# Patient Record
Sex: Female | Born: 1970 | Hispanic: Yes | Marital: Married | Smoking: Never smoker
Health system: Southern US, Community
[De-identification: ages and names within clinical notes are randomized; demographics above are authoritative.]

## PROBLEM LIST (undated history)

## (undated) DIAGNOSIS — E119 Type 2 diabetes mellitus without complications: Secondary | ICD-10-CM

## (undated) DIAGNOSIS — I10 Essential (primary) hypertension: Secondary | ICD-10-CM

## (undated) HISTORY — PX: NASAL SINUS SURGERY: SHX719

---

## 2013-05-29 ENCOUNTER — Encounter (HOSPITAL_COMMUNITY): Payer: Self-pay | Admitting: Emergency Medicine

## 2013-05-29 ENCOUNTER — Emergency Department (HOSPITAL_COMMUNITY): Payer: Self-pay

## 2013-05-29 ENCOUNTER — Emergency Department (HOSPITAL_COMMUNITY)
Admission: EM | Admit: 2013-05-29 | Discharge: 2013-05-29 | Disposition: A | Discharge status: Home / Self Care | Payer: Self-pay | Attending: Emergency Medicine | Admitting: Emergency Medicine

## 2013-05-29 DIAGNOSIS — R69 Illness, unspecified: Secondary | ICD-10-CM

## 2013-05-29 DIAGNOSIS — Z3202 Encounter for pregnancy test, result negative: Secondary | ICD-10-CM | POA: Insufficient documentation

## 2013-05-29 DIAGNOSIS — E119 Type 2 diabetes mellitus without complications: Secondary | ICD-10-CM | POA: Insufficient documentation

## 2013-05-29 DIAGNOSIS — R638 Other symptoms and signs concerning food and fluid intake: Secondary | ICD-10-CM | POA: Insufficient documentation

## 2013-05-29 DIAGNOSIS — Z79899 Other long term (current) drug therapy: Secondary | ICD-10-CM | POA: Insufficient documentation

## 2013-05-29 DIAGNOSIS — J111 Influenza due to unidentified influenza virus with other respiratory manifestations: Secondary | ICD-10-CM | POA: Insufficient documentation

## 2013-05-29 DIAGNOSIS — I10 Essential (primary) hypertension: Secondary | ICD-10-CM | POA: Insufficient documentation

## 2013-05-29 HISTORY — DX: Type 2 diabetes mellitus without complications: E11.9

## 2013-05-29 HISTORY — DX: Essential (primary) hypertension: I10

## 2013-05-29 LAB — CBG MONITORING, ED: Glucose-Capillary: 114 mg/dL — ABNORMAL HIGH (ref 70–99)

## 2013-05-29 LAB — CBC WITH DIFFERENTIAL/PLATELET
BASOS PCT: 0 % (ref 0–1)
Basophils Absolute: 0 10*3/uL (ref 0.0–0.1)
EOS ABS: 0 10*3/uL (ref 0.0–0.7)
Eosinophils Relative: 0 % (ref 0–5)
HCT: 32.7 % — ABNORMAL LOW (ref 36.0–46.0)
HEMOGLOBIN: 9.8 g/dL — AB (ref 12.0–15.0)
Lymphocytes Relative: 10 % — ABNORMAL LOW (ref 12–46)
Lymphs Abs: 0.6 10*3/uL — ABNORMAL LOW (ref 0.7–4.0)
MCH: 20.9 pg — AB (ref 26.0–34.0)
MCHC: 30 g/dL (ref 30.0–36.0)
MCV: 69.6 fL — ABNORMAL LOW (ref 78.0–100.0)
MONO ABS: 0.5 10*3/uL (ref 0.1–1.0)
Monocytes Relative: 8 % (ref 3–12)
Neutro Abs: 5.2 10*3/uL (ref 1.7–7.7)
Neutrophils Relative %: 82 % — ABNORMAL HIGH (ref 43–77)
PLATELETS: 278 10*3/uL (ref 150–400)
RBC: 4.7 MIL/uL (ref 3.87–5.11)
RDW: 18.5 % — ABNORMAL HIGH (ref 11.5–15.5)
WBC: 6.3 10*3/uL (ref 4.0–10.5)

## 2013-05-29 LAB — URINALYSIS, ROUTINE W REFLEX MICROSCOPIC
Bilirubin Urine: NEGATIVE
Glucose, UA: NEGATIVE mg/dL
Hgb urine dipstick: NEGATIVE
Ketones, ur: 15 mg/dL — AB
Leukocytes, UA: NEGATIVE
NITRITE: NEGATIVE
Protein, ur: NEGATIVE mg/dL
Urobilinogen, UA: 0.2 mg/dL (ref 0.0–1.0)
pH: 6 (ref 5.0–8.0)

## 2013-05-29 LAB — COMPREHENSIVE METABOLIC PANEL
ALT: 20 U/L (ref 0–35)
AST: 16 U/L (ref 0–37)
Albumin: 3.7 g/dL (ref 3.5–5.2)
Alkaline Phosphatase: 92 U/L (ref 39–117)
BUN: 7 mg/dL (ref 6–23)
CALCIUM: 8.8 mg/dL (ref 8.4–10.5)
CO2: 20 mEq/L (ref 19–32)
Chloride: 99 mEq/L (ref 96–112)
Creatinine, Ser: 0.58 mg/dL (ref 0.50–1.10)
GFR calc Af Amer: >90 mL/min (ref >90)
GFR calc non Af Amer: >90 mL/min (ref >90)
GLUCOSE: 127 mg/dL — AB (ref 70–99)
Potassium: 4.1 mEq/L (ref 3.7–5.3)
SODIUM: 134 meq/L — AB (ref 137–147)
Total Bilirubin: 0.3 mg/dL (ref 0.3–1.2)
Total Protein: 7.6 g/dL (ref 6.0–8.3)

## 2013-05-29 LAB — LACTIC ACID, PLASMA: LACTIC ACID, VENOUS: 1 mmol/L (ref 0.5–2.2)

## 2013-05-29 LAB — POC OCCULT BLOOD, ED: Fecal Occult Bld: NEGATIVE

## 2013-05-29 LAB — PREGNANCY, URINE: PREG TEST UR: NEGATIVE

## 2013-05-29 LAB — LIPASE, BLOOD: LIPASE: 29 U/L (ref 11–59)

## 2013-05-29 MED ORDER — ONDANSETRON HCL 4 MG/2ML IJ SOLN
4.0000 mg | Freq: Once | INTRAMUSCULAR | Status: AC
  Administered 2013-05-29: 4 mg via INTRAVENOUS
  Filled 2013-05-29: qty 2

## 2013-05-29 MED ORDER — SODIUM CHLORIDE 0.9 % IV SOLN
1000.0000 mL | Freq: Once | INTRAVENOUS | Status: AC
  Administered 2013-05-29: 1000 mL via INTRAVENOUS

## 2013-05-29 MED ORDER — SODIUM CHLORIDE 0.9 % IV BOLUS (SEPSIS)
1000.0000 mL | Freq: Once | INTRAVENOUS | Status: AC
  Administered 2013-05-29: 1000 mL via INTRAVENOUS

## 2013-05-29 MED ORDER — ACETAMINOPHEN 325 MG PO TABS
650.0000 mg | ORAL_TABLET | Freq: Once | ORAL | Status: AC
  Administered 2013-05-29: 650 mg via ORAL
  Filled 2013-05-29: qty 2

## 2013-05-29 MED ORDER — HYDROMORPHONE HCL PF 1 MG/ML IJ SOLN
1.0000 mg | Freq: Once | INTRAMUSCULAR | Status: AC
  Administered 2013-05-29: 1 mg via INTRAVENOUS
  Filled 2013-05-29: qty 1

## 2013-05-29 MED ORDER — SODIUM CHLORIDE 0.9 % IV SOLN: 1000.0000 mL | INTRAVENOUS | Status: DC

## 2013-05-29 NOTE — Discharge Instructions (Signed)
Your us shows some abnormality of the liver which is likely due to fat but needs to be followed up, possibly with an MRI to assure there is no other abnormality, specifically a mass.   Gripe en el adulto  (Influenza, Adult)  La gripe (influenza) es una infeccin en la boca, la nariz y la garganta (tracto respiratorio) causada por un virus. La gripe puede hacer que se sienta muy mal. Se disemina fcilmente de Neomia Dearuna persona a otra (se contagia).  CUIDADOS EN EL HOGAR   Tome slo los medicamentos que le haya indicado el mdico.  Utilice un humidificador de niebla fra para facilitar la respiracin.  Descanse todo lo que pueda [place] que la fiebre haya bajado. Generalmente esto lleva entre 3 y [address] das.  Beba gran cantidad de lquido para mantener el pis (orina) de tono claro o amarillo plido.  Cbrase la boca y la Darene Lamernariz al toser o estornudar.  Lave bien sus manos para evitar el contagio de la enfermedad.  Lanny HurstQudese en su casa y no concurra al Aleen Campitrabajo o a la escuela hasta que la fiebre haya desaparecido al menos por 1 da completo.  Colquese la vacuna antigripal todos los [place]. SOLICITE AYUDA DE INMEDIATO SI:   Siente dificultad para respirar o Company secretaryle falta el aire.  Observa que la piel o las uas estn Gardenaazules.  Presenta dolor intenso o rigidez en el cuello.  Comienza a sentir dolor de cabeza, en el rostro o en los odos.  La fiebre empeora o aparece nuevamente.  Tiene Programme researcher, broadcasting/film/videomalestar estomacal (nuseas), vmitos, o tiene deposiciones acuosas (diarrea.  Siente dolor en el pecho.  Tiene una tos intensa que empeora o elimina un esputo ms espeso. ASEGRESE DE QUE:   Comprende estas instrucciones.  Controlar su enfermedad.  Solicitar ayuda de inmediato si no mejora o si empeora. Document Released: 05/29/2008 Document Revised: 09/01/2011 [facility] Patient Information 2014 [place], [place].

## 2013-05-29 NOTE — ED Notes (Signed)
Pt presents with upper abd pain and nausea x1 week, symptoms worsened yesterday, that increase with palpation. Pt reports her symptoms started a week ago after starting her new diabetes medication. Pt also has chills, body aches, watery eyes, nasal congestion and runny nose x2 days

## 2013-05-29 NOTE — ED Notes (Addendum)
Pt transported to XR and [place]

## 2013-05-29 NOTE — ED Notes (Signed)
Pts husband states that pt started experiencing abd pain and nausea yesterday. Denies vomiting and diarrhea.

## 2013-05-29 NOTE — ED Notes (Signed)
Pt discharged home with all belongings, pt alert and ambulatory upon discharge, no new rx prescribed, pt verbalizes understanding of discharge instructions, interpreter phone line used for discharg, pt driven home by spouse

## 2013-05-29 NOTE — ED Provider Notes (Addendum)
CSN: [id number]     Arrival date & time 05/29/13  [id number] History   First MD Initiated Contact with Patient 05/29/13 [phone]    Level V caveat secondary to language barrier history obtained through interpreter Chief Complaint  Patient presents with  . Abdominal Pain     (Consider location/radiation/quality/duration/timing/severity/associated sxs/prior Treatment) HPI 43 year old female presents today complaining of nasal congestion, cough, chills, and body aches. Her symptoms began yesterday. She feels worse today. She has been nauseated but has not had any vomiting. She also complains of some upper abdominal pain which began about a week ago and has come and gone. Her husband had similar symptoms in the days preceding her illness. She has not had a known fever, vomiting, or diarrhea. She has a history of non-insulin-dependent diabetes and had her medications changed last week. She states she was diet controlled for a period of time prior to this. She's not sure what her blood sugars have been running. She has been taking liquids but has not been eating as much as usual. [place] C[place] today. [place]   Past Medical History  Diagnosis Date  . Diabetes mellitus without complication   . Hypertension    History reviewed. No pertinent past surgical history. No family history on file. History  Substance Use Topics  . Smoking status: Never Smoker   . Smokeless tobacco: Not on file  . Alcohol Use: No   OB History   Grav Para Term Preterm Abortions TAB SAB Ect Mult Living                 Review of Systems  Constitutional: Positive for chills and appetite change. Negative for unexpected weight change.  HENT: Positive for congestion.   Eyes: Negative for discharge and itching.  Respiratory: Positive for cough. Negative for apnea, chest tightness and shortness of breath.   Cardiovascular: Negative.    Gastrointestinal: Positive for nausea. Negative for vomiting and diarrhea.  Endocrine: Negative.   Genitourinary: Negative.   Musculoskeletal: Positive for myalgias.  Allergic/Immunologic: Negative.   Neurological: Positive for weakness.  Hematological: Negative.   Psychiatric/Behavioral: Negative.   All other systems reviewed and are negative.      Allergies  Review of patient's allergies indicates no known allergies.  Home Medications   Current Outpatient Rx  Name  Route  Sig  Dispense  Refill  . lisinopril (PRINIVIL,ZESTRIL) 10 MG tablet   Oral   Take 10 mg by mouth daily.         . metFORMIN (GLUCOPHAGE-XR) 500 MG 24 hr tablet   Oral   Take 500 mg by mouth daily with breakfast.          BP 109/71  Pulse 105  Temp(Src) 99.7 F (37.6 C) (Rectal)  Resp 16  Ht 5\' 4"  (1.626 m)  Wt 156 lb (70.761 kg)  BMI 26.76 kg/m2  SpO2 98% Physical Exam WDWN female uncomfortable appearing Heent- normal, eyes perrl,tm normal, mouth -mucous membranes slightly dry, pharynx clear Neck supple cv- tachycardia with regular rhythm Abdomen- soft , moderate ttp epigastrium and ruq Back- normal Extremities- normal Psych- normal affect Neuro- a and o x3, no focal deficits,  Normal gait.  ED Course  Procedures (including critical care time) Labs Review Labs Reviewed  CBC WITH DIFFERENTIAL - Abnormal; Notable for the following:    Hemoglobin 9.8 (*)    HCT 32.7 (*)    MCV  69.6 (*)    MCH 20.9 (*)    RDW 18.5 (*)    Neutrophils Relative % 82 (*)    Lymphocytes Relative 10 (*)    Lymphs Abs 0.6 (*)    All other components within normal limits  COMPREHENSIVE METABOLIC PANEL - Abnormal; Notable for the following:    Sodium 134 (*)    Glucose, Bld 127 (*)    All other components within normal limits  URINALYSIS, ROUTINE W REFLEX MICROSCOPIC - Abnormal; Notable for the following:    Color, Urine STRAW (*)    APPearance HAZY (*)    Specific Gravity, Urine <1.005 (*)     Ketones, ur 15 (*)    All other components within normal limits  CBG MONITORING, ED - Abnormal; Notable for the following:    Glucose-Capillary 114 (*)    All other components within normal limits  LIPASE, BLOOD  PREGNANCY, URINE  LACTIC ACID, PLASMA  OCCULT BLOOD X 1 CARD TO LAB, STOOL  POC OCCULT BLOOD, ED   Imaging Review Dg Chest 2 View  05/29/2013   CLINICAL DATA:  Cough, fever  EXAM: CHEST  2 VIEW  COMPARISON:  None.  FINDINGS: Cardiomediastinal silhouette is unremarkable. No acute infiltrate or pleural effusion. No pulmonary edema. Bony thorax is unremarkable.  IMPRESSION: No active cardiopulmonary disease.   Electronically Signed   By: Natasha Mead M.D.   On: 05/29/2013 08:05   US Abdomen Complete  05/29/2013   CLINICAL DATA:  Abdominal pain.  EXAM: ULTRASOUND ABDOMEN COMPLETE  COMPARISON:  No priors.  FINDINGS: Gallbladder:  No gallstones or wall thickening visualized. No sonographic Murphy sign noted.  Common bile duct:  Diameter: 4 mm in the porta hepatis.  Liver:  Heterogeneously hyperechoic, likely to reflect diffuse fatty infiltration. Several areas of more hypoechoic hepatic parenchyma are noted, the largest one is located centrally measuring approximately 5.1 x 3.6 x 3.8 cm. No intrahepatic biliary ductal dilatation.  IVC:  No abnormality visualized.  Pancreas:  Visualized portion unremarkable.  Spleen:  Size and appearance within normal limits.  8.2 cm in length.  Right Kidney:  Length: 11.0 cm. Echogenicity within normal limits. No mass or hydronephrosis visualized.  Left Kidney:  Length: 11.0 cm. Echogenicity within normal limits. No mass or hydronephrosis visualized.  Abdominal aorta:  No aneurysm visualized.  Other findings:  None.  IMPRESSION: 1. No acute findings to account for the patient's symptoms. Specifically, no evidence of gallstones or findings to suggest acute cholecystitis at this time. 2. Heterogeneous appearance of the liver. This may simply represent hepatic  steatosis with regional areas of focal fatty sparing, however, the possibility of an underlying hepatic mass is not excluded, particularly in the central aspect of the liver. This should be further characterized with followup nonemergent MRI of the abdomen with and without IV gadolinium in the near future.   Electronically Signed   By: Trudie Reed M.D.   On: 05/29/2013 08:48     EKG Interpretation None      MDM   Final diagnoses:  None    Patient presents today with influenza-like illness. She is tachycardic on presentation with systolic blood pressure in the 90s. She received 2 L of normal saline and heart rate has decreased and blood pressure is 105 systolically. Lactic acid is normal. She's also had some abdominal pain in her upper abdomen. She is somewhat tender to palpation in her epigastrium and right upper quadrant. White blood cell count, lipase, and liver function tests  are normal and ultrasound did not show any evidence of gallstones or acute cholecystitis. She does appear to have hepatic steatosis and is advised regarding followup. She feels improved here after fluids and Tylenol and is discharged to home with return precautions.  1 influenza-like illness patient presents today with symptoms consistent with likely viral in etiology with husband having recent similar symptoms. Lactic acid is negative. Initial hr 118, now 90.  SBP 105. I suspect some of her weakness is due to decreased po and taking her lisinopril  She is advised to hold lisinopril until better. 2 abdominal pain unclear etiology but likely secondary to #1.  3 hepatic steatosis this is been discussed with the patient as advised regarding followup. 4- anemia- hemocult negative-denies recent bleeding, does not appear to have acute cause-advised follow up.  5-dm- stable Hilario Quarry, MD 05/29/13 [id number]  Hilario Quarry, MD 05/29/13 1191  Hilario Quarry, MD 07/25/13 [phone]

## 2013-05-29 NOTE — ED Notes (Addendum)
Pt returned from XT/US, ambulated to bathroom with no assistance to obtain UA sample

## 2013-05-29 NOTE — ED Notes (Signed)
EDP Ray at bedside to assess pt and explain plan of care

## 2014-02-05 ENCOUNTER — Emergency Department: Payer: Self-pay | Admitting: Emergency Medicine

## 2014-06-01 ENCOUNTER — Emergency Department: Payer: Self-pay | Admitting: Emergency Medicine

## 2015-03-27 IMAGING — CR DG CHEST 2V
2 series · 2 of 2 positions shown · non-contrast
Comparison: None.

CLINICAL DATA: Cough, fever

EXAM:
CHEST  2 VIEW

[w chest pa]
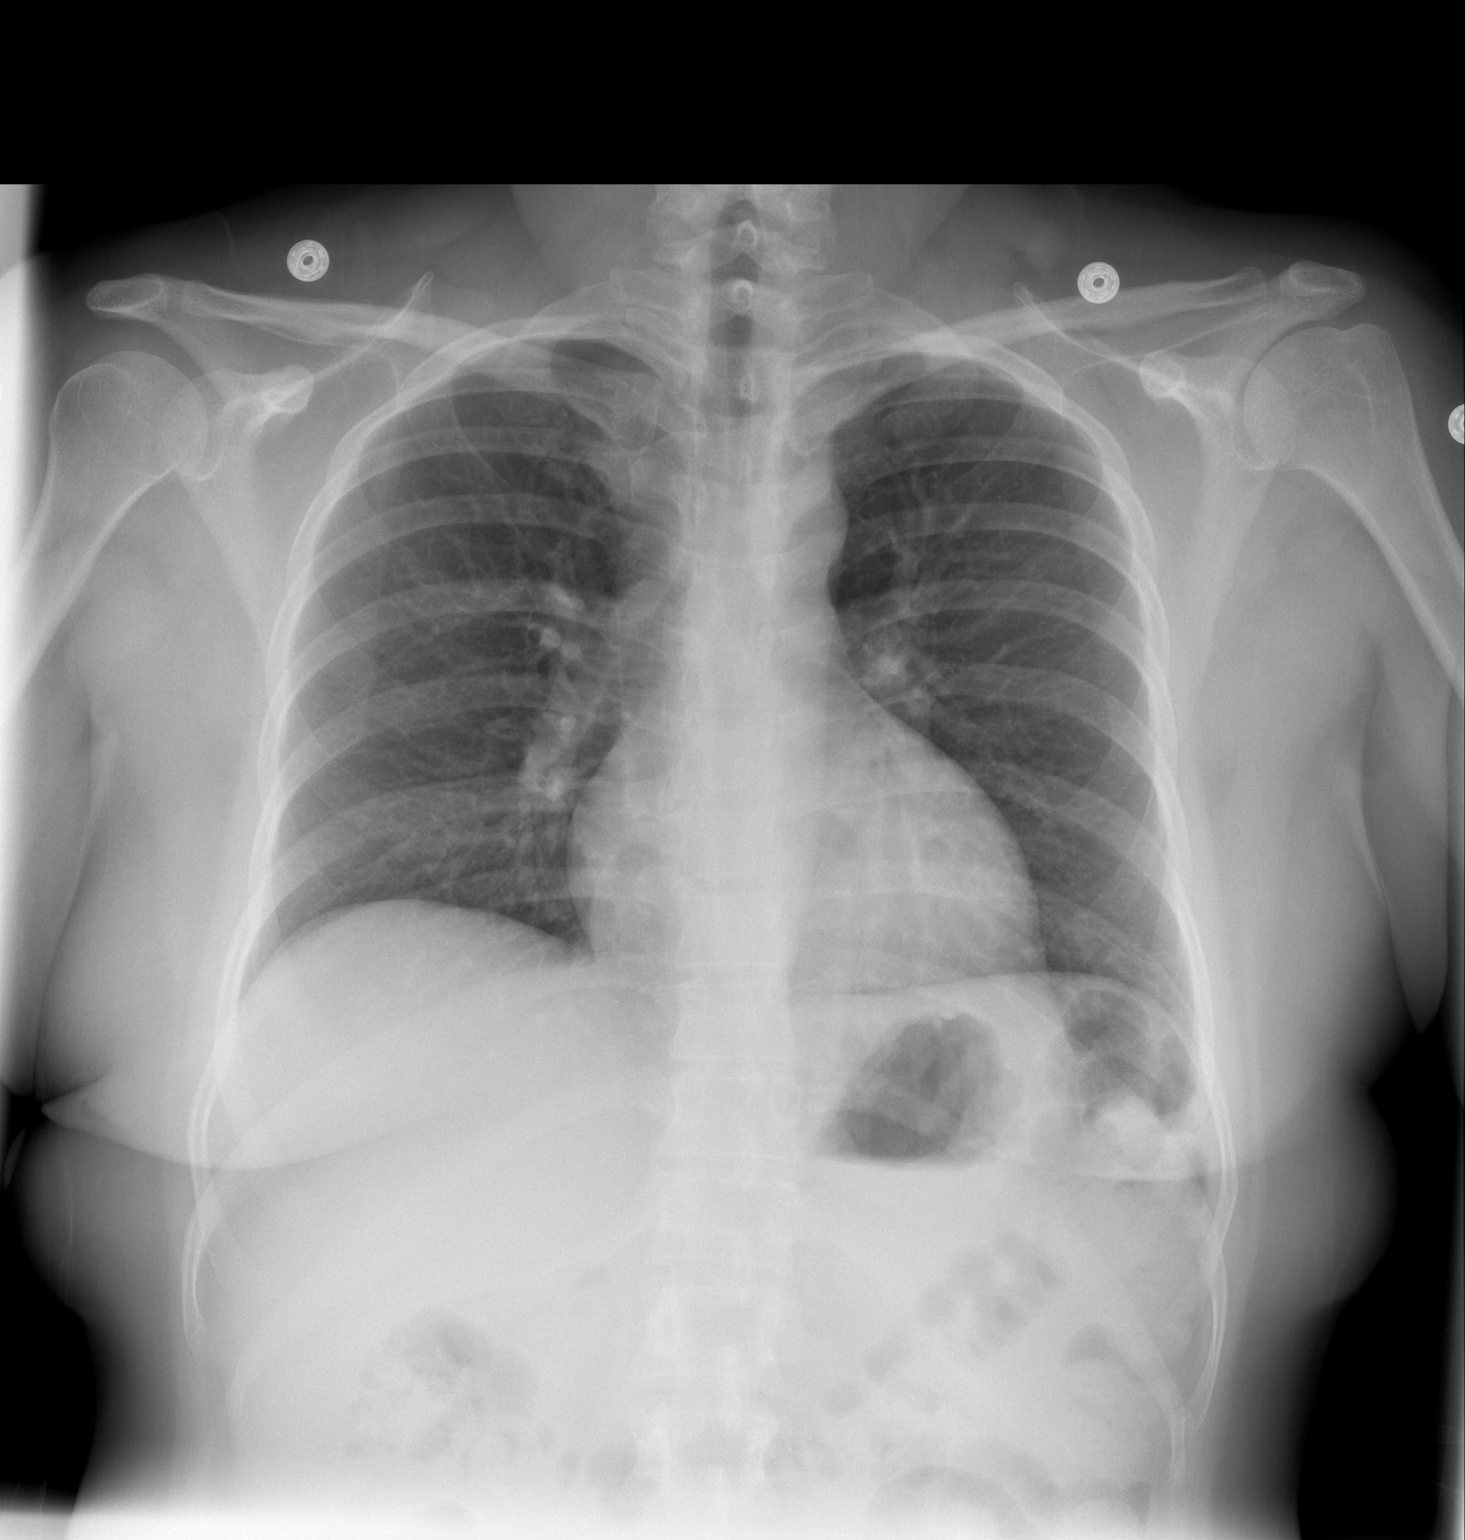

[w chest lat]
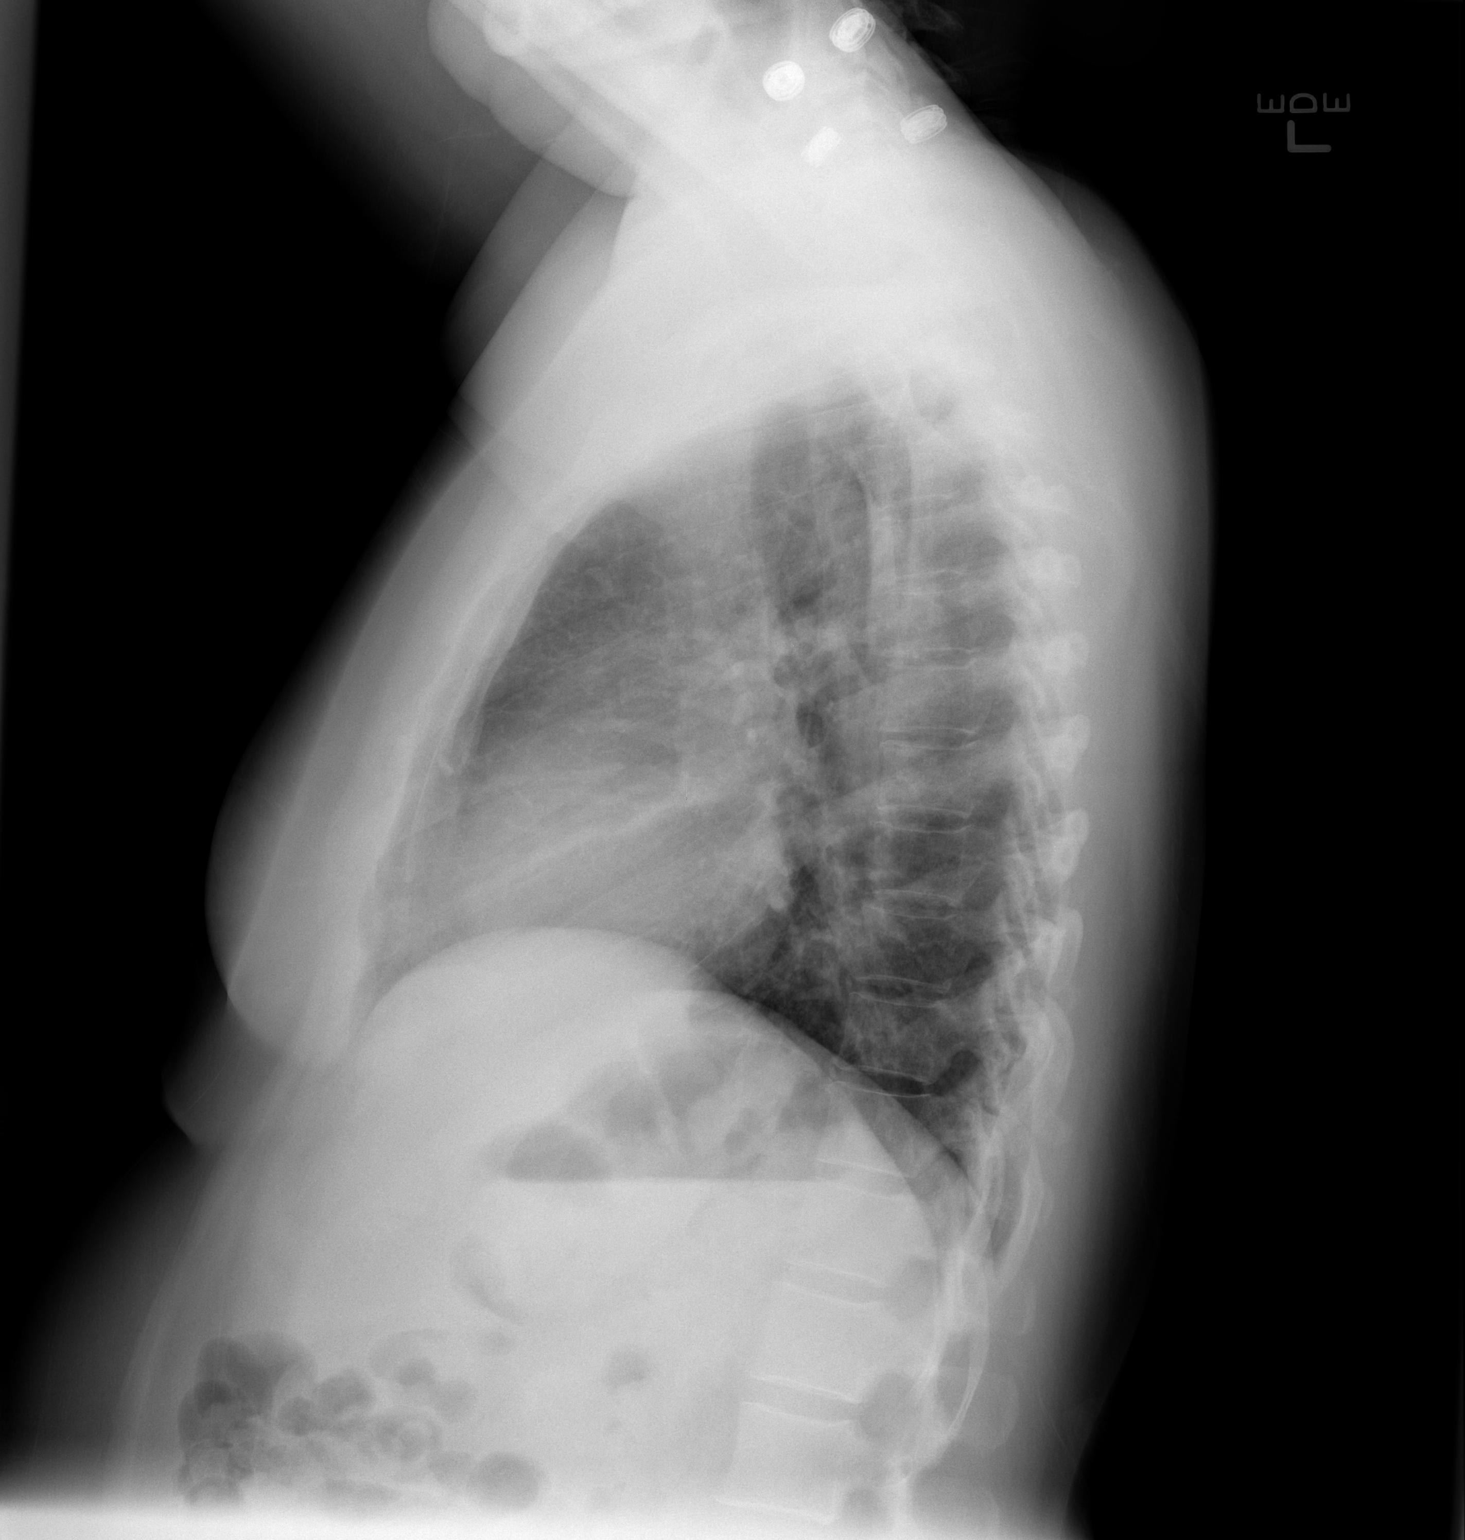

[2 of 2 positions shown; findings below may reference images not displayed]

FINDINGS: Cardiomediastinal silhouette is unremarkable. No acute infiltrate or
pleural effusion. No pulmonary edema. Bony thorax is unremarkable.
IMPRESSION: No active cardiopulmonary disease.

## 2015-09-24 ENCOUNTER — Ambulatory Visit
Admission: EM | Admit: 2015-09-24 | Discharge: 2015-09-24 | Disposition: A | Discharge status: Home / Self Care | Payer: Self-pay | Attending: Internal Medicine | Admitting: Internal Medicine

## 2015-09-24 DIAGNOSIS — H1030 Unspecified acute conjunctivitis, unspecified eye: Secondary | ICD-10-CM

## 2015-09-24 DIAGNOSIS — J019 Acute sinusitis, unspecified: Secondary | ICD-10-CM

## 2015-09-24 MED ORDER — SULFACETAMIDE SODIUM 10 % OP SOLN: 1.0000 [drp] | OPHTHALMIC | Status: AC

## 2015-09-24 MED ORDER — AMOXICILLIN-POT CLAVULANATE 875-125 MG PO TABS: 1.0000 | ORAL_TABLET | Freq: Two times a day (BID) | ORAL | Status: DC

## 2015-09-24 MED ORDER — PREDNISONE 50 MG PO TABS: 50.0000 mg | ORAL_TABLET | Freq: Every day | ORAL | Status: AC

## 2015-09-24 NOTE — Discharge Instructions (Signed)
Prescriptions for amoxicillin/clavulanate and prednisone, and an antibiotic eye drop, were sent to the pharmacy.   Followup with ENT to discuss further options.

## 2015-09-24 NOTE — ED Provider Notes (Signed)
CSN: [id number]     Arrival date & time 09/24/15  1238 History   First MD Initiated Contact with Patient 09/24/15 1527     Chief Complaint  Patient presents with  . Eye Problem  . Eye Pain   HPI 45 year old lady with history of nasal polyps and chronic sinus congestion.  She has had sinus surgery 3 years ago, with recurrence of symptoms in about March of this year. In the last week or 2 she has had purulent eye discharge with intermittent blurriness, and mild swelling and discomfort of the upper and lower lids bilaterally.  Marked sinus congestion. [place]. No fever. Some cough. Postnasal drainage.  Past Medical History  Diagnosis Date  . Diabetes mellitus without complication (HCC)   . Hypertension    Past surgical history: nasal/sinus surgery  No family history on file. Social History  Substance Use Topics  . Smoking status: Never Smoker   . Smokeless tobacco: None  . Alcohol Use: No    Review of Systems  All other systems reviewed and are negative.   Allergies  Review of patient's allergies indicates no known allergies.  Home Medications   Prior to Admission medications   Medication Sig Start Date End Date Taking? Authorizing Provider  lisinopril (PRINIVIL,ZESTRIL) 10 MG tablet Take 10 mg by mouth daily.    Historical Provider, MD  metFORMIN (GLUCOPHAGE-XR) 500 MG 24 hr tablet Take 500 mg by mouth daily with breakfast.    Historical Provider, MD     BP 120/80 mmHg  Pulse 81  Temp(Src) 98 F (36.7 C) (Oral)  SpO2 100%  LMP 08/28/2015 Physical Exam  Constitutional: She is oriented to person, place, and time. No distress.  Alert, nicely groomed  HENT:  Head: Atraumatic.  Bilateral TMs are quite dull, no erythema Marked nasal congestion/occlusion bilaterally Voice sounds markedly congested Throat is pink with copious postnasal drainage and very large pink tonsils  Eyes:  Conjugate gaze, bilateral conjunctivae slightly  injected Bilateral upper and lower eyelid slightly puffy/mildly erythematous No eye discharge evident at the moment   Neck: Neck supple.  Cardiovascular: Normal rate and regular rhythm.   Pulmonary/Chest: No respiratory distress. She has no wheezes. She has no rales.  Lungs clear, symmetric breath sounds  Abdominal: She exhibits no distension.  Musculoskeletal: Normal range of motion.  No leg swelling  Neurological: She is alert and oriented to person, place, and time.  Skin: Skin is warm and dry.  No cyanosis  Nursing note and vitals reviewed.   ED Course  Procedures (including critical care time)  none  MDM   1. Acute sinusitis with symptoms > 10 days   2. Acute conjunctivitis    Meds ordered this encounter  Medications  . predniSONE (DELTASONE) 50 MG tablet    Sig: Take 1 tablet (50 mg total) by mouth daily.    Dispense:  3 tablet    Refill:  0  . amoxicillin-clavulanate (AUGMENTIN) 875-125 MG tablet    Sig: Take 1 tablet by mouth every 12 (twelve) hours.    Dispense:  14 tablet    Refill:  0  . sulfacetamide (BLEPH-10) 10 % ophthalmic solution    Sig: Place 1-2 drops into both eyes every 4 (four) hours.    Dispense:  15 mL    Refill:  0   followup with ENT for further guidance on management of chronic congestion.    Eustace MooreLaura W Harmony Sandell, MD 09/25/15 1400

## 2015-09-24 NOTE — ED Notes (Signed)
bilat eye pain, itchy and redness reported.  Eyes are reddened upon assessment.   Denies blurriness.

## 2016-04-16 ENCOUNTER — Ambulatory Visit
Admission: RE | Admit: 2016-04-16 | Discharge: 2016-04-16 | Disposition: A | Discharge status: Home / Self Care | Payer: Self-pay | Source: Ambulatory Visit | Attending: Family Medicine | Admitting: Family Medicine

## 2016-04-16 ENCOUNTER — Other Ambulatory Visit: Payer: Self-pay | Admitting: Family Medicine

## 2016-04-16 DIAGNOSIS — R52 Pain, unspecified: Secondary | ICD-10-CM

## 2016-04-16 DIAGNOSIS — R071 Chest pain on breathing: Secondary | ICD-10-CM | POA: Insufficient documentation

## 2018-04-06 ENCOUNTER — Ambulatory Visit: Payer: Self-pay

## 2019-06-02 ENCOUNTER — Ambulatory Visit: Payer: Self-pay | Attending: Internal Medicine

## 2019-06-02 DIAGNOSIS — Z23 Encounter for immunization: Secondary | ICD-10-CM

## 2019-06-02 NOTE — Progress Notes (Signed)
   Covid-19 Vaccination Clinic  Name:  Carollee Nussbaumer    MRN: [id number] DOB: [birth year]  06/02/2019  Ms. Lurlie Wigen was observed post Covid-19 immunization for 15 minutes without incident. She was provided with Vaccine Information Sheet and instruction to access the V-Safe system.   Ms. Layton Tappan was instructed to call 911 with any severe reactions post vaccine: Marland Kitchen Difficulty breathing  . Swelling of face and throat  . A fast heartbeat  . A bad rash all over body  . Dizziness and weakness   Immunizations Administered    Name Date Dose VIS Date Route   Pfizer COVID-19 Vaccine 06/02/2019  7:04 PM 0.3 mL 02/24/2019 Intramuscular   Manufacturer: [employer], [employer]   Lot: [id number]   NDC: [address]-8

## 2019-06-24 ENCOUNTER — Ambulatory Visit: Payer: Self-pay | Attending: Internal Medicine

## 2019-06-24 DIAGNOSIS — Z23 Encounter for immunization: Secondary | ICD-10-CM

## 2019-06-24 NOTE — Progress Notes (Signed)
   Covid-19 Vaccination Clinic  Name:  Romanita Fager    MRN: [id number] DOB: August 20, 1970  06/24/2019  Ms. Dallys Nowakowski was observed post Covid-19 immunization for 15 minutes without incident. She was provided with Vaccine Information Sheet and instruction to access the V-Safe system.   Ms. Maki Hege was instructed to call 911 with any severe reactions post vaccine: Marland Kitchen Difficulty breathing  . Swelling of face and throat  . A fast heartbeat  . A bad rash all over body  . Dizziness and weakness   Immunizations Administered    Name Date Dose VIS Date Route   Pfizer COVID-19 Vaccine 06/24/2019  3:25 PM 0.3 mL 02/24/2019 Intramuscular   Manufacturer: [employer], [employer]   Lot: [id number]   NDC: [address]-2

## 2020-06-11 ENCOUNTER — Ambulatory Visit: Payer: Self-pay | Attending: Oncology

## 2020-12-26 ENCOUNTER — Encounter: Payer: Self-pay | Admitting: Emergency Medicine

## 2020-12-26 ENCOUNTER — Emergency Department
Admission: EM | Admit: 2020-12-26 | Discharge: 2020-12-26 | Disposition: A | Discharge status: Home / Self Care | Payer: BLUE CROSS/BLUE SHIELD | Attending: Emergency Medicine | Admitting: Emergency Medicine

## 2020-12-26 ENCOUNTER — Other Ambulatory Visit: Payer: Self-pay

## 2020-12-26 ENCOUNTER — Emergency Department: Payer: BLUE CROSS/BLUE SHIELD

## 2020-12-26 DIAGNOSIS — Z79899 Other long term (current) drug therapy: Secondary | ICD-10-CM | POA: Insufficient documentation

## 2020-12-26 DIAGNOSIS — S93402A Sprain of unspecified ligament of left ankle, initial encounter: Secondary | ICD-10-CM | POA: Diagnosis not present

## 2020-12-26 DIAGNOSIS — S99912A Unspecified injury of left ankle, initial encounter: Secondary | ICD-10-CM | POA: Diagnosis present

## 2020-12-26 DIAGNOSIS — Z7984 Long term (current) use of oral hypoglycemic drugs: Secondary | ICD-10-CM | POA: Insufficient documentation

## 2020-12-26 DIAGNOSIS — I10 Essential (primary) hypertension: Secondary | ICD-10-CM | POA: Insufficient documentation

## 2020-12-26 DIAGNOSIS — M79652 Pain in left thigh: Secondary | ICD-10-CM | POA: Diagnosis not present

## 2020-12-26 DIAGNOSIS — Y9301 Activity, walking, marching and hiking: Secondary | ICD-10-CM | POA: Insufficient documentation

## 2020-12-26 DIAGNOSIS — Y9289 Other specified places as the place of occurrence of the external cause: Secondary | ICD-10-CM | POA: Insufficient documentation

## 2020-12-26 DIAGNOSIS — W108XXA Fall (on) (from) other stairs and steps, initial encounter: Secondary | ICD-10-CM | POA: Insufficient documentation

## 2020-12-26 DIAGNOSIS — E119 Type 2 diabetes mellitus without complications: Secondary | ICD-10-CM | POA: Diagnosis not present

## 2020-12-26 DIAGNOSIS — M25562 Pain in left knee: Secondary | ICD-10-CM | POA: Insufficient documentation

## 2020-12-26 MED ORDER — IBUPROFEN 400 MG PO TABS: 400.0000 mg | ORAL_TABLET | Freq: Four times a day (QID) | ORAL | 0 refills | Status: AC | PRN

## 2020-12-26 MED ORDER — HYDROCODONE-ACETAMINOPHEN 5-325 MG PO TABS
1.0000 | ORAL_TABLET | ORAL | Status: AC
  Administered 2020-12-26: 1 via ORAL
  Filled 2020-12-26: qty 1

## 2020-12-26 MED ORDER — HYDROCODONE-ACETAMINOPHEN 5-325 MG PO TABS: 1.0000 | ORAL_TABLET | ORAL | 0 refills | Status: DC | PRN

## 2020-12-26 NOTE — ED Notes (Signed)
Brace applied to left ankle per order.

## 2020-12-26 NOTE — ED Notes (Signed)
Patient stable and discharged with all personal belongings and AVS. AVS and discharge instructions reviewed with patient and opportunity for questions provided.   

## 2020-12-26 NOTE — Discharge Instructions (Addendum)
Rest ice and elevate the left ankle.  Use ankle splint as needed for ambulation.  Use crutches as needed for ambulation.  Take medications as prescribed and return to the ER for any worsening symptoms or to changes in health.

## 2020-12-26 NOTE — ED Triage Notes (Signed)
Pt to ED from home after fall this morning around 0900 from a couple steps and twisted left ankle.  Was able to continue working at that time but now is having trouble bearing weight and pain is radiating up leg.

## 2020-12-26 NOTE — ED Provider Notes (Signed)
[facility] EMERGENCY DEPARTMENT Provider Note   CSN: [id number] Arrival date & time: 12/26/20  1902     History Chief Complaint  Patient presents with   Toni Flowers is a 50 y.o. female.  Presents to the emergency department for evaluation of a fall that occurred this morning.  She was going down steps, twisted her left ankle and developed lateral ankle pain.  She was able to continue working but now has not been able to walk.  She has had increased pain and swelling to the lateral aspect of the ankle.  She has not any medications for pain.  She is not using any assistive devices for ambulation.  She denies any numbness or tingling.  She does have some soreness along the left leg/knee and thigh but no other injury to her body.  HPI     Past Medical History:  Diagnosis Date   Diabetes mellitus without complication (HCC)    Hypertension     There are no problems to display for this patient.   History reviewed. No pertinent surgical history.   OB History   No obstetric history on file.     History reviewed. No pertinent family history.  Social History   Tobacco Use   Smoking status: Never   Smokeless tobacco: Never  Substance Use Topics   Alcohol use: No   Drug use: No    Home Medications Prior to Admission medications   Medication Sig Start Date End Date Taking? Authorizing Provider  HYDROcodone-acetaminophen (NORCO) 5-325 MG tablet Take 1 tablet by mouth every 4 (four) hours as needed for moderate pain. 12/26/20  Yes Evon Slack, PA-C  ibuprofen (ADVIL) 400 MG tablet Take 1 tablet (400 mg total) by mouth every 6 (six) hours as needed. 12/26/20  Yes Evon Slack, PA-C  amoxicillin-clavulanate (AUGMENTIN) 875-125 MG tablet Take 1 tablet by mouth every 12 (twelve) hours. 09/24/15   Isa Rankin, MD  lisinopril (PRINIVIL,ZESTRIL) 10 MG tablet Take 10 mg by mouth daily.    [provider]  metFORMIN  (GLUCOPHAGE-XR) 500 MG 24 hr tablet Take 500 mg by mouth daily with breakfast.    [provider]  predniSONE (DELTASONE) 50 MG tablet Take 1 tablet (50 mg total) by mouth daily. 09/24/15   Isa Rankin, MD    Allergies    Patient has no known allergies.  Review of Systems   Review of Systems  Constitutional:  Negative for activity change.  Eyes:  Negative for pain and visual disturbance.  Respiratory:  Negative for shortness of breath.   Cardiovascular:  Negative for chest pain and leg swelling.  Gastrointestinal:  Negative for abdominal pain.  Genitourinary:  Negative for flank pain and pelvic pain.  Musculoskeletal:  Positive for arthralgias, gait problem and joint swelling. Negative for myalgias, neck pain and neck stiffness.  Skin:  Negative for wound.  Neurological:  Negative for dizziness, syncope, weakness, light-headedness, numbness and headaches.  Psychiatric/Behavioral:  Negative for confusion and decreased concentration.    Physical Exam Updated Vital Signs BP (!) 121/93 (BP Location: Left Arm)   Pulse (!) 109   Temp 98.4 F (36.9 C) (Oral)   Resp 19   Ht 5' (1.524 m)   Wt 67.1 kg   LMP 08/28/2015   SpO2 98%   BMI 28.90 kg/m   Physical Exam Constitutional:      Appearance: She is well-developed.  HENT:     Head: Normocephalic  and atraumatic.     Right Ear: External ear normal.     Left Ear: External ear normal.     Nose: Nose normal.  Eyes:     Conjunctiva/sclera: Conjunctivae normal.     Pupils: Pupils are equal, round, and reactive to light.  Cardiovascular:     Rate and Rhythm: Normal rate.  Pulmonary:     Effort: Pulmonary effort is normal. No respiratory distress.     Breath sounds: Normal breath sounds.  Abdominal:     Palpations: Abdomen is soft.     Tenderness: There is no abdominal tenderness.  Musculoskeletal:        General: No deformity. Normal range of motion.     Cervical back: Normal range of motion.     Comments: Left  lower extremity shows tenderness to the left lateral ankle with soft tissue swelling.  She is tender along the lateral malleolus and ATFL region.  She is good range of motion of the knee hip with no discomfort.  She has no tenderness to palpation from the proximal hip down to just above the ankle.  She has not tenderness throughout the foot.  Sensation is intact distally.  Skin:    General: Skin is warm and dry.     Findings: No rash.  Neurological:     Mental Status: She is alert and oriented to person, place, and time.     Cranial Nerves: No cranial nerve deficit.     Coordination: Coordination normal.  Psychiatric:        Behavior: Behavior normal.    ED Results / Procedures / Treatments   Labs (all labs ordered are listed, but only abnormal results are displayed) Labs Reviewed - No data to display  EKG None  Radiology DG Ankle Complete Left  Result Date: 12/26/2020 CLINICAL DATA:  Fall, left ankle pain EXAM: LEFT ANKLE COMPLETE - 3+ VIEW COMPARISON:  None. FINDINGS: There is no evidence of fracture, dislocation, or joint effusion. There is no evidence of arthropathy or other focal bone abnormality. Soft tissues are unremarkable. IMPRESSION: Negative. Electronically Signed   By: Helyn Numbers M.D.   On: 12/26/2020 20:07    Procedures Procedures   Medications Ordered in ED Medications  HYDROcodone-acetaminophen (NORCO/VICODIN) 5-325 MG per tablet 1 tablet (1 tablet Oral Given 12/26/20 2059)    ED Course  I have reviewed the triage vital signs and the nursing notes.  Pertinent labs & imaging results that were available during my care of the patient were reviewed by me and considered in my medical decision making (see chart for details).    MDM Rules/Calculators/A&P                         50 year old female with left ankle injury.  History and exam consistent with lateral ankle sprain.  X-rays negative for any acute bony abnormality.  She will rest ice and elevate the  ankle.  She is given crutches and ankle splint.  She will follow-up PCP or orthopedics in 1 week if no improvement.  She understands signs symptoms return to the ER for. Final Clinical Impression(s) / ED Diagnoses Final diagnoses:  Mild ankle sprain, left, initial encounter    Rx / DC Orders ED Discharge Orders          Ordered    HYDROcodone-acetaminophen (NORCO) 5-325 MG tablet  Every 4 hours PRN        12/26/20 2053    ibuprofen (ADVIL)  400 MG tablet  Every 6 hours PRN        12/26/20 2053             Ronnette Juniper 12/26/20 2105    Delton Prairie, MD 12/26/20 2113

## 2021-01-08 ENCOUNTER — Ambulatory Visit
Admission: EM | Admit: 2021-01-08 | Discharge: 2021-01-08 | Disposition: A | Discharge status: Home / Self Care | Payer: BLUE CROSS/BLUE SHIELD

## 2021-01-08 ENCOUNTER — Other Ambulatory Visit: Payer: Self-pay

## 2021-01-08 DIAGNOSIS — J069 Acute upper respiratory infection, unspecified: Secondary | ICD-10-CM | POA: Diagnosis not present

## 2021-01-08 MED ORDER — PROMETHAZINE-DM 6.25-15 MG/5ML PO SYRP: 5.0000 mL | ORAL_SOLUTION | Freq: Four times a day (QID) | ORAL | 0 refills | Status: AC | PRN

## 2021-01-08 MED ORDER — BENZONATATE 100 MG PO CAPS: 200.0000 mg | ORAL_CAPSULE | Freq: Three times a day (TID) | ORAL | 0 refills | Status: AC

## 2021-01-08 MED ORDER — OSELTAMIVIR PHOSPHATE 75 MG PO CAPS: 75.0000 mg | ORAL_CAPSULE | Freq: Two times a day (BID) | ORAL | 0 refills | Status: AC

## 2021-01-08 MED ORDER — IPRATROPIUM BROMIDE 0.06 % NA SOLN
2.0000 | Freq: Four times a day (QID) | NASAL | 12 refills | Status: AC
Start: 2021-01-08 — End: ?

## 2021-01-08 NOTE — ED Triage Notes (Signed)
Pt here with husband, pt with dry cough, rib pain d/t cough, runny nose, fatigue, generalized bodyaches, fever, chills, and bilateral ear pain.   No home COVID test  Pt has had nasal surgery in past, "cannot take COVID test properly".   OTC Advil for pain/fever

## 2021-01-08 NOTE — ED Provider Notes (Signed)
[facility]    CSN: [id number] Arrival date & time: 01/08/21  1905      History   Chief Complaint Chief Complaint  Patient presents with   Fever   Otalgia   Cough    HPI Toni Flowers is a 50 y.o. female.   HPI  50 year old female here for evaluation of respiratory complaints.  Patient is here with her husband for evaluation of runny nose with clear nasal discharge, fever with a T-max of 100.3, fatigue, chills, body aches, bilateral ear pain, sore throat, nonproductive cough, shortness of breath and wheezing.  There is also associated with some pain in the front and back of her chest wall with deep respiration and cough.  She has been exposed to others with influenza A.  She endorses nausea but no vomiting or diarrhea.  Past Medical History:  Diagnosis Date   Diabetes mellitus without complication (HCC)    Hypertension     There are no problems to display for this patient.   Past Surgical History:  Procedure Laterality Date   NASAL SINUS SURGERY      OB History   No obstetric history on file.      Home Medications    Prior to Admission medications   Medication Sig Start Date End Date Taking? Authorizing Provider  benzonatate (TESSALON) 100 MG capsule Take 2 capsules (200 mg total) by mouth every 8 (eight) hours. 01/08/21  Yes Becky Augusta, NP  ibuprofen (ADVIL) 400 MG tablet Take 1 tablet (400 mg total) by mouth every 6 (six) hours as needed. 12/26/20  Yes Amador Cunas C, PA-C  ipratropium (ATROVENT) 0.06 % nasal spray Place 2 sprays into both nostrils 4 (four) times daily. 01/08/21  Yes Becky Augusta, NP  JARDIANCE 10 MG TABS tablet Take 10 mg by mouth daily. 11/07/20  Yes [provider]  lisinopril (PRINIVIL,ZESTRIL) 10 MG tablet Take 10 mg by mouth daily.   Yes [provider]  metFORMIN (GLUCOPHAGE-XR) 500 MG 24 hr tablet Take 500 mg by mouth daily with breakfast.   Yes [provider]  oseltamivir (TAMIFLU) 75  MG capsule Take 1 capsule (75 mg total) by mouth every 12 (twelve) hours. 01/08/21  Yes Becky Augusta, NP  promethazine-dextromethorphan (PROMETHAZINE-DM) 6.25-15 MG/5ML syrup Take 5 mLs by mouth 4 (four) times daily as needed. 01/08/21  Yes Becky Augusta, NP  amoxicillin-clavulanate (AUGMENTIN) 875-125 MG tablet Take 1 tablet by mouth every 12 (twelve) hours. 09/24/15   Isa Rankin, MD  HYDROcodone-acetaminophen (NORCO) 5-325 MG tablet Take 1 tablet by mouth every 4 (four) hours as needed for moderate pain. 12/26/20   Evon Slack, PA-C  predniSONE (DELTASONE) 50 MG tablet Take 1 tablet (50 mg total) by mouth daily. 09/24/15   Isa Rankin, MD    Family History No family history on file.  Social History Social History   Tobacco Use   Smoking status: Never   Smokeless tobacco: Never  Vaping Use   Vaping Use: Never used  Substance Use Topics   Alcohol use: No   Drug use: No     Allergies   Patient has no known allergies.   Review of Systems Review of Systems  Constitutional:  Positive for fever. Negative for activity change and appetite change.  HENT:  Positive for congestion, ear pain, rhinorrhea, sore throat and tinnitus.   Respiratory:  Positive for cough, shortness of breath and wheezing.   Cardiovascular:  Positive for chest pain.  Gastrointestinal:  Positive  for nausea. Negative for diarrhea and vomiting.  Musculoskeletal:  Positive for arthralgias and myalgias.  Skin:  Negative for rash.  Neurological:  Positive for headaches.  Hematological: Negative.   Psychiatric/Behavioral: Negative.      Physical Exam Triage Vital Signs ED Triage Vitals  Enc Vitals Group     BP 01/08/21 1955 125/89     Pulse Rate 01/08/21 1955 96     Resp 01/08/21 1955 20     Temp 01/08/21 1955 98.9 F (37.2 C)     Temp Source 01/08/21 1955 Oral     SpO2 01/08/21 1955 98 %     Weight 01/08/21 1958 140 lb (63.5 kg)     Height 01/08/21 1958 5' (1.524 m)     Head  Circumference --      Peak Flow --      Pain Score 01/08/21 1957 8     Pain Loc --      Pain Edu? --      Excl. in GC? --    No data found.  Updated Vital Signs BP 125/89 (BP Location: Left Arm)   Pulse 96   Temp 98.9 F (37.2 C) (Oral)   Resp 20   Ht 5' (1.524 m)   Wt 140 lb (63.5 kg)   LMP 08/28/2015   SpO2 98%   BMI 27.34 kg/m   Visual Acuity Right Eye Distance:   Left Eye Distance:   Bilateral Distance:    Right Eye Near:   Left Eye Near:    Bilateral Near:     Physical Exam Vitals and nursing note reviewed.  Constitutional:      General: She is not in acute distress.    Appearance: Normal appearance. She is not ill-appearing.  HENT:     Head: Normocephalic and atraumatic.     Right Ear: Tympanic membrane, ear canal and external ear normal. There is no impacted cerumen.     Left Ear: Tympanic membrane, ear canal and external ear normal. There is no impacted cerumen.     Nose: Congestion and rhinorrhea present.     Mouth/Throat:     Mouth: Mucous membranes are moist.     Pharynx: Oropharynx is clear. Posterior oropharyngeal erythema present.  Cardiovascular:     Rate and Rhythm: Normal rate and regular rhythm.     Pulses: Normal pulses.     Heart sounds: Normal heart sounds. No murmur heard.   No gallop.  Pulmonary:     Effort: Pulmonary effort is normal.     Breath sounds: Normal breath sounds. No wheezing, rhonchi or rales.  Musculoskeletal:     Cervical back: Normal range of motion and neck supple.  Lymphadenopathy:     Cervical: No cervical adenopathy.  Skin:    General: Skin is warm and dry.     Capillary Refill: Capillary refill takes less than 2 seconds.     Findings: No erythema or rash.  Neurological:     General: No focal deficit present.     Mental Status: She is alert and oriented to person, place, and time.  Psychiatric:        Mood and Affect: Mood normal.        Behavior: Behavior normal.        Thought Content: Thought content  normal.        Judgment: Judgment normal.     UC Treatments / Results  Labs (all labs ordered are listed, but only abnormal results are displayed) Labs  Reviewed - No data to display  EKG   Radiology No results found.  Procedures Procedures (including critical care time)  Medications Ordered in UC Medications - No data to display  Initial Impression / Assessment and Plan / UC Course  I have reviewed the triage vital signs and the nursing notes.  Pertinent labs & imaging results that were available during my care of the patient were reviewed by me and considered in my medical decision making (see chart for details).  Patient is a pleasant though ill-appearing 50 year old female here for evaluation of respiratory complaints as outlined HPI above.  Patient has had exposure to influenza A but they were reluctant to be tested due to history of no surgery in the past.  Patient's physical exam reveals pearly gray tympanic membranes bilaterally with normal light reflex and clear external auditory canals.  Nasal mucosa is erythematous and edematous with copious clear nasal discharge in both nares.  Oropharyngeal exam reveals posterior oropharyngeal erythema with clear postnasal drip.  No cervical lymphadenopathy appreciated on exam.  Cardiopulmonary exam reveals clear lung sounds in all fields.  Patient's exam is consistent with a viral URI.  Patient reluctant to be swabbed for influenza or COVID secondary to no surgery in the past.  Given her influenza A exposure we will treat her presumptively with Tamiflu.  Loss give Atrovent nasal spray to help with nasal congestion, Tessalon Perles and Promethazine DM cough syrup to help with cough and congestion.  Patient to use Tylenol and ibuprofen as needed for fever and body aches.   Final Clinical Impressions(s) / UC Diagnoses   Final diagnoses:  Viral URI with cough     Discharge Instructions      Take the Tamiflu twice daily for 5 days.    Use the Atrovent nasal spray, 2 squirts in each nostril every 6 hours, as needed for runny nose and postnasal drip.  Use the Tessalon Perles every 8 hours during the day.  Take them with a small sip of water.  They may give you some numbness to the base of your tongue or a metallic taste in your mouth, this is normal.  Use the Promethazine DM cough syrup at bedtime for cough and congestion.  It will make you drowsy so do not take it during the day.  Use OTC Tylenol and Ibuprofen as needed for body aches and fever.   Return for reevaluation or see your primary care provider for any new or worsening symptoms.      ED Prescriptions     Medication Sig Dispense Auth. Provider   benzonatate (TESSALON) 100 MG capsule Take 2 capsules (200 mg total) by mouth every 8 (eight) hours. 21 capsule Becky Augusta, NP   ipratropium (ATROVENT) 0.06 % nasal spray Place 2 sprays into both nostrils 4 (four) times daily. 15 mL Becky Augusta, NP   promethazine-dextromethorphan (PROMETHAZINE-DM) 6.25-15 MG/5ML syrup Take 5 mLs by mouth 4 (four) times daily as needed. 118 mL Becky Augusta, NP   oseltamivir (TAMIFLU) 75 MG capsule Take 1 capsule (75 mg total) by mouth every 12 (twelve) hours. 10 capsule Becky Augusta, NP      PDMP not reviewed this encounter.   Becky Augusta, NP 01/08/21 2042

## 2021-01-08 NOTE — Discharge Instructions (Signed)
Take the Tamiflu twice daily for 5 days.   Use the Atrovent nasal spray, 2 squirts in each nostril every 6 hours, as needed for runny nose and postnasal drip.  Use the Tessalon Perles every 8 hours during the day.  Take them with a small sip of water.  They may give you some numbness to the base of your tongue or a metallic taste in your mouth, this is normal.  Use the Promethazine DM cough syrup at bedtime for cough and congestion.  It will make you drowsy so do not take it during the day.  Use OTC Tylenol and Ibuprofen as needed for body aches and fever.   Return for reevaluation or see your primary care provider for any new or worsening symptoms.

## 2021-07-14 ENCOUNTER — Other Ambulatory Visit: Payer: Self-pay | Admitting: Family Medicine

## 2021-07-14 DIAGNOSIS — Z1231 Encounter for screening mammogram for malignant neoplasm of breast: Secondary | ICD-10-CM

## 2022-07-31 LAB — EXTERNAL GENERIC LAB PROCEDURE

## 2022-07-31 LAB — COLOGUARD

## 2022-09-12 ENCOUNTER — Emergency Department
Admission: EM | Admit: 2022-09-12 | Discharge: 2022-09-12 | Disposition: A | Discharge status: Home / Self Care | Payer: BLUE CROSS/BLUE SHIELD | Attending: Emergency Medicine | Admitting: Emergency Medicine

## 2022-09-12 ENCOUNTER — Other Ambulatory Visit: Payer: Self-pay

## 2022-09-12 ENCOUNTER — Emergency Department: Payer: BLUE CROSS/BLUE SHIELD

## 2022-09-12 DIAGNOSIS — N2889 Other specified disorders of kidney and ureter: Secondary | ICD-10-CM | POA: Diagnosis not present

## 2022-09-12 DIAGNOSIS — R300 Dysuria: Secondary | ICD-10-CM | POA: Diagnosis present

## 2022-09-12 DIAGNOSIS — N3000 Acute cystitis without hematuria: Secondary | ICD-10-CM | POA: Insufficient documentation

## 2022-09-12 DIAGNOSIS — R748 Abnormal levels of other serum enzymes: Secondary | ICD-10-CM | POA: Insufficient documentation

## 2022-09-12 LAB — URINALYSIS, ROUTINE W REFLEX MICROSCOPIC
Bilirubin Urine: NEGATIVE
Glucose, UA: >=500 mg/dL — AB
Ketones, ur: 5 mg/dL — AB
Nitrite: NEGATIVE
Protein, ur: 100 mg/dL — AB
Specific Gravity, Urine: 1.03 (ref 1.005–1.030)
Squamous Epithelial / HPF: NONE SEEN /HPF (ref 0–5)
WBC, UA: >50 WBC/hpf (ref 0–5)
pH: 5 (ref 5.0–8.0)

## 2022-09-12 LAB — CBC
HCT: 47.5 % — ABNORMAL HIGH (ref 36.0–46.0)
Hemoglobin: 15.3 g/dL — ABNORMAL HIGH (ref 12.0–15.0)
MCH: 29.8 pg (ref 26.0–34.0)
MCHC: 32.2 g/dL (ref 30.0–36.0)
MCV: 92.4 fL (ref 80.0–100.0)
Platelets: 275 10*3/uL (ref 150–400)
RBC: 5.14 MIL/uL — ABNORMAL HIGH (ref 3.87–5.11)
RDW: 13.1 % (ref 11.5–15.5)
WBC: 9.1 10*3/uL (ref 4.0–10.5)
nRBC: 0 % (ref 0.0–0.2)

## 2022-09-12 LAB — COMPREHENSIVE METABOLIC PANEL
ALT: 26 U/L (ref 0–44)
AST: 18 U/L (ref 15–41)
Albumin: 4.3 g/dL (ref 3.5–5.0)
Alkaline Phosphatase: 87 U/L (ref 38–126)
Anion gap: 11 (ref 5–15)
BUN: 14 mg/dL (ref 6–20)
CO2: 27 mmol/L (ref 22–32)
Calcium: 10.1 mg/dL (ref 8.9–10.3)
Chloride: 103 mmol/L (ref 98–111)
Creatinine, Ser: 0.62 mg/dL (ref 0.44–1.00)
GFR, Estimated: >60 mL/min (ref >60)
Glucose, Bld: 160 mg/dL — ABNORMAL HIGH (ref 70–99)
Potassium: 4.3 mmol/L (ref 3.5–5.1)
Sodium: 141 mmol/L (ref 135–145)
Total Bilirubin: 0.8 mg/dL (ref 0.3–1.2)
Total Protein: 7.5 g/dL (ref 6.5–8.1)

## 2022-09-12 LAB — LIPASE, BLOOD: Lipase: 60 U/L — ABNORMAL HIGH (ref 11–51)

## 2022-09-12 MED ORDER — SODIUM CHLORIDE 0.9 % IV SOLN
1.0000 g | Freq: Once | INTRAVENOUS | Status: AC
  Administered 2022-09-12: 1 g via INTRAVENOUS
  Filled 2022-09-12: qty 10

## 2022-09-12 MED ORDER — SODIUM CHLORIDE 0.9 % IV BOLUS
1000.0000 mL | Freq: Once | INTRAVENOUS | Status: AC
  Administered 2022-09-12: 1000 mL via INTRAVENOUS

## 2022-09-12 MED ORDER — HYDROCODONE-ACETAMINOPHEN 5-325 MG PO TABS: 1.0000 | ORAL_TABLET | Freq: Four times a day (QID) | ORAL | 0 refills | Status: AC | PRN

## 2022-09-12 MED ORDER — KETOROLAC TROMETHAMINE 15 MG/ML IJ SOLN
15.0000 mg | Freq: Once | INTRAMUSCULAR | Status: AC
  Administered 2022-09-12: 15 mg via INTRAVENOUS
  Filled 2022-09-12: qty 1

## 2022-09-12 MED ORDER — HYDROCODONE-ACETAMINOPHEN 5-325 MG PO TABS
1.0000 | ORAL_TABLET | Freq: Once | ORAL | Status: AC
  Administered 2022-09-12: 1 via ORAL
  Filled 2022-09-12: qty 1

## 2022-09-12 MED ORDER — MORPHINE SULFATE (PF) 4 MG/ML IV SOLN
4.0000 mg | Freq: Once | INTRAVENOUS | Status: AC
  Administered 2022-09-12: 4 mg via INTRAVENOUS
  Filled 2022-09-12: qty 1

## 2022-09-12 MED ORDER — IOHEXOL 300 MG/ML  SOLN
100.0000 mL | Freq: Once | INTRAMUSCULAR | Status: AC | PRN
  Administered 2022-09-12: 80 mL via INTRAVENOUS

## 2022-09-12 MED ORDER — CEPHALEXIN 500 MG PO CAPS: 500.0000 mg | ORAL_CAPSULE | Freq: Four times a day (QID) | ORAL | 0 refills | Status: AC

## 2022-09-12 NOTE — ED Triage Notes (Signed)
C/O lower abdominal pain and pain with urination x 8 days.  The same symptoms occurred about 8 years ago and patient was diagnosed with UTI.

## 2022-09-12 NOTE — ED Provider Notes (Signed)
[facility] Provider Note    Event Date/Time   First MD Initiated Contact with Patient 09/12/22 1128     (approximate)   History   No chief complaint on file.   HPI  Getzemani Dowis is a 52 y.o. female presenting to the emergency department for evaluation of abdominal pain.  Patient reports about 8 days ago she began to develop lower abdominal pain primarily in her suprapubic area with associated dysuria, urinary frequency, and urinary hesitancy.  Does report she had similar symptoms about 8 years ago at which time she was diagnosed with a UTI.  She does report that over the past week, her pain has migrated from her suprapubic region and is now radiating towards her back.  Does report subjective fevers.    Physical Exam   Triage Vital Signs: ED Triage Vitals  Enc Vitals Group     BP 09/12/22 1106 123/81     Pulse Rate 09/12/22 1106 93     Resp 09/12/22 1106 16     Temp 09/12/22 1106 98.2 F (36.8 C)     Temp Source 09/12/22 1106 Oral     SpO2 09/12/22 1106 100 %     Weight 09/12/22 1104 139 lb 15.9 oz (63.5 kg)     Height 09/12/22 1104 5' (1.524 m)     Head Circumference --      Peak Flow --      Pain Score 09/12/22 1102 8     Pain Loc --      Pain Edu? --      Excl. in GC? --     Most recent vital signs: Vitals:   09/12/22 1106  BP: 123/81  Pulse: 93  Resp: 16  Temp: 98.2 F (36.8 C)  SpO2: 100%     General: Awake, interactive, appears somewhat uncomfortable CV:  Regular rate, good peripheral perfusion.  Resp:  Lungs clear, unlabored respirations.  Abd:  Soft, nondistended, tender to palpation in the suprapubic region and throughout the lower abdomen, no significant tenderness of the upper abdomen, no CVA tenderness Neuro:  Symmetric facial movement, fluid speech   ED Results / Procedures / Treatments   Labs (all labs ordered are listed, but only abnormal results are displayed) Labs Reviewed  LIPASE, BLOOD - Abnormal;  Notable for the following components:      Result Value   Lipase 60 (*)    All other components within normal limits  COMPREHENSIVE METABOLIC PANEL - Abnormal; Notable for the following components:   Glucose, Bld 160 (*)    All other components within normal limits  CBC - Abnormal; Notable for the following components:   RBC 5.14 (*)    Hemoglobin 15.3 (*)    HCT 47.5 (*)    All other components within normal limits  URINALYSIS, ROUTINE W REFLEX MICROSCOPIC - Abnormal; Notable for the following components:   Color, Urine YELLOW (*)    APPearance CLOUDY (*)    Glucose, UA >=500 (*)    Hgb urine dipstick MODERATE (*)    Ketones, ur 5 (*)    Protein, ur 100 (*)    Leukocytes,Ua LARGE (*)    Bacteria, UA RARE (*)    All other components within normal limits  URINE CULTURE     EKG EKG independently reviewed interpreted by myself (ER attending) demonstrates:    RADIOLOGY Imaging independently reviewed and interpreted by myself demonstrates:    PROCEDURES:  Critical Care performed: No  Procedures  MEDICATIONS ORDERED IN ED: Medications  sodium chloride 0.9 % bolus 1,000 mL (0 mLs Intravenous Stopped 09/12/22 1309)  morphine (PF) 4 MG/ML injection 4 mg (4 mg Intravenous Given 09/12/22 1152)  iohexol (OMNIPAQUE) 300 MG/ML solution 100 mL (80 mLs Intravenous Contrast Given 09/12/22 1205)  ketorolac (TORADOL) 15 MG/ML injection 15 mg (15 mg Intravenous Given 09/12/22 1352)  HYDROcodone-acetaminophen (NORCO/VICODIN) 5-325 MG per tablet 1 tablet (1 tablet Oral Given 09/12/22 1354)  cefTRIAXone (ROCEPHIN) 1 g in sodium chloride 0.9 % 100 mL IVPB (1 g Intravenous New Bag/Given 09/12/22 1355)     IMPRESSION / MDM / ASSESSMENT AND PLAN / ED COURSE  I reviewed the triage vital signs and the nursing notes.  Differential diagnosis includes, but is not limited to, UTI, pyelonephritis, renal stone, ovarian pathology, appendicitis  Patient's presentation is most consistent with acute  presentation with potential threat to life or bodily function.  52 year old female presenting with lower abdominal pain with associated urinary symptoms, now radiating upwards in her lower back and flank.  Concern for possible development of pyelonephritis.  Labs sent from triage.  Will obtain CT to further evaluate.  Denies nausea, fluids and morphine ordered for symptomatic treatment.  CT demonstrates right ureteritis without evidence of pyelonephritis or renal stones.  Lab work with mildly elevated lipase, less than 3 times the upper limit of normal, not consistent with pancreatitis.  CBC and CMP without severe derangements.  UA is concerning for infection.  On reevaluation, patient did have some ongoing pain.  She was ordered for Toradol and Norco.  Will also give initial dose of Rocephin.   On reevaluation feels much improved.  Will DC with prescription for Keflex and Norco with strict return precautions.      FINAL CLINICAL IMPRESSION(S) / ED DIAGNOSES   Final diagnoses:  Acute cystitis without hematuria  Ureteritis     Rx / DC Orders   ED Discharge Orders          Ordered    HYDROcodone-acetaminophen (NORCO) 5-325 MG tablet  Every 6 hours PRN        09/12/22 1427    cephALEXin (KEFLEX) 500 MG capsule  4 times daily        09/12/22 1427             Note:  This document was prepared using Dragon voice recognition software and may include unintentional dictation errors.   Trinna Post, MD 09/12/22 1520

## 2022-09-12 NOTE — Discharge Instructions (Signed)
You were seen in the emergency department today for your abdominal pain.  Your CT scan and urine testing did show an infection.  I sent a prescription for antibiotics to your pharmacy.  Please take these as directed.  You can use over-the-counter medicine to help with pain.  If you have breakthrough pain, I have sent a prescription for a narcotic medicine to your pharmacy. This can make you drowsy, do not drive or operate machinery when taking this.  Return to the ER for any new or worsening symptoms

## 2022-09-14 LAB — URINE CULTURE: Culture: >=100000 — AB

## 2022-09-30 LAB — COLOGUARD

## 2022-09-30 LAB — EXTERNAL GENERIC LAB PROCEDURE

## 2022-10-24 IMAGING — CR DG ANKLE COMPLETE 3+V*L*
1 series · 3 of 3 positions shown · non-contrast
Comparison: None.

CLINICAL DATA: Fall, left ankle pain

EXAM:
LEFT ANKLE COMPLETE - 3+ VIEW

[Series 1: x ankle ap left · 0.14mm/px · 3 of 3 slices shown]
[im 1/3]
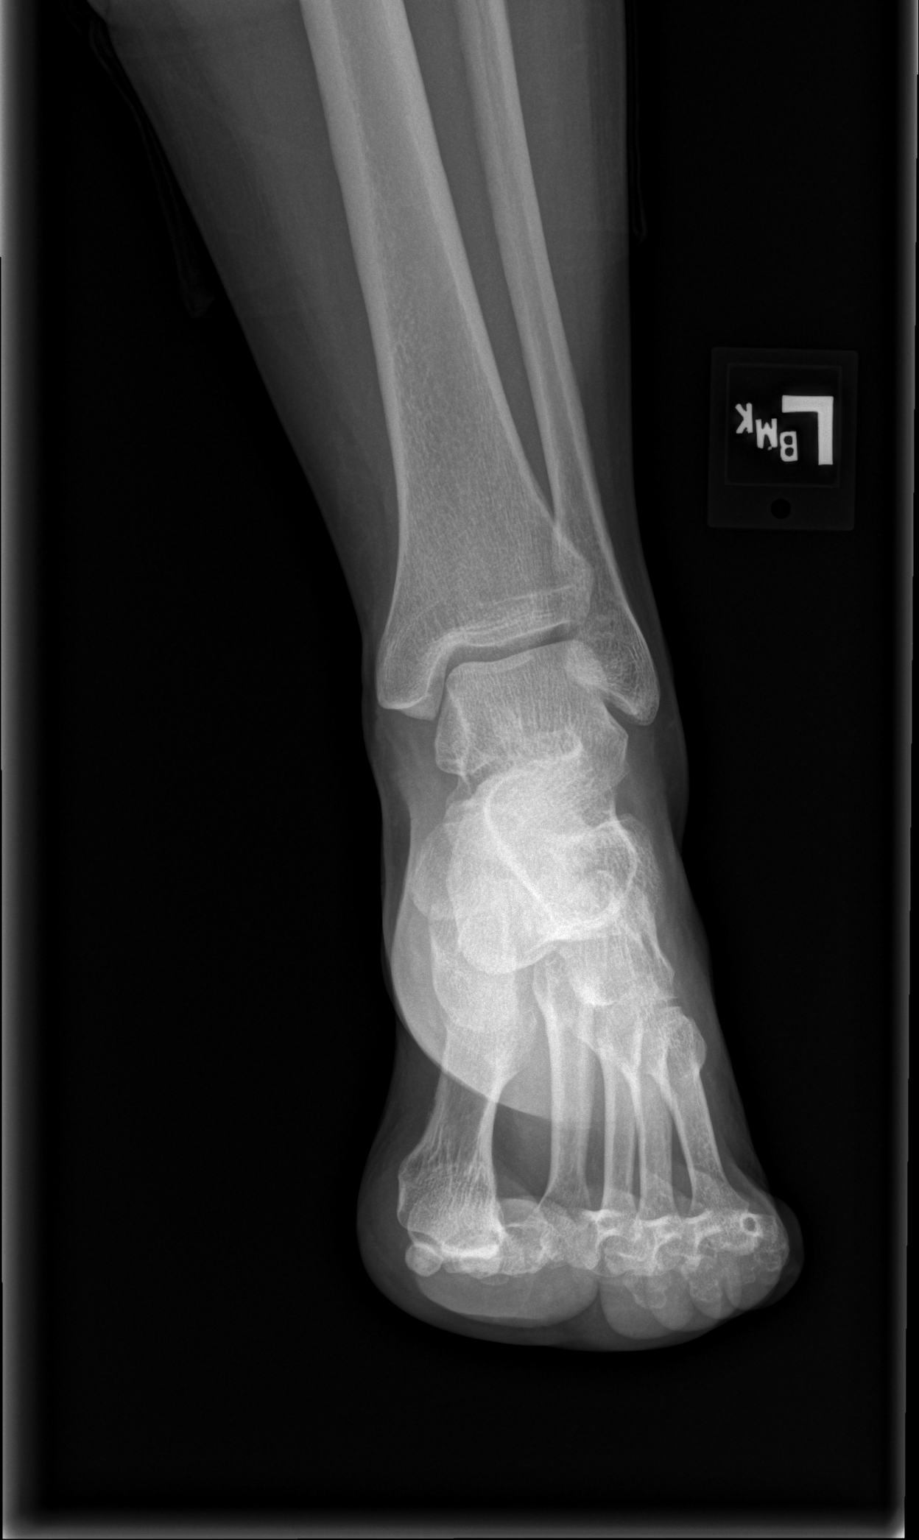
[im 2/3]
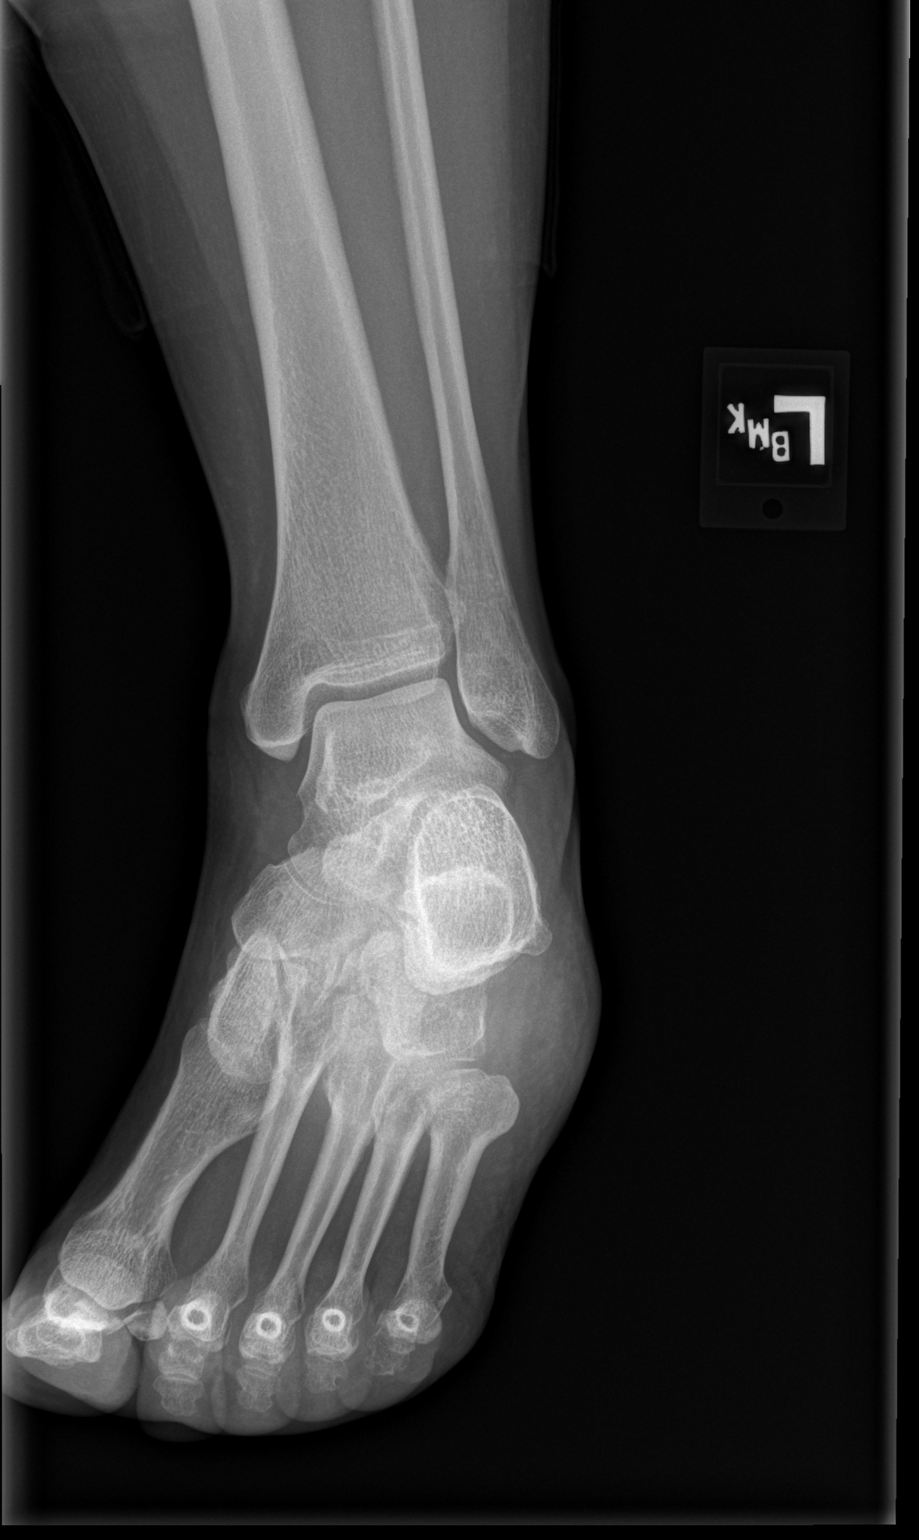
[im 3/3]
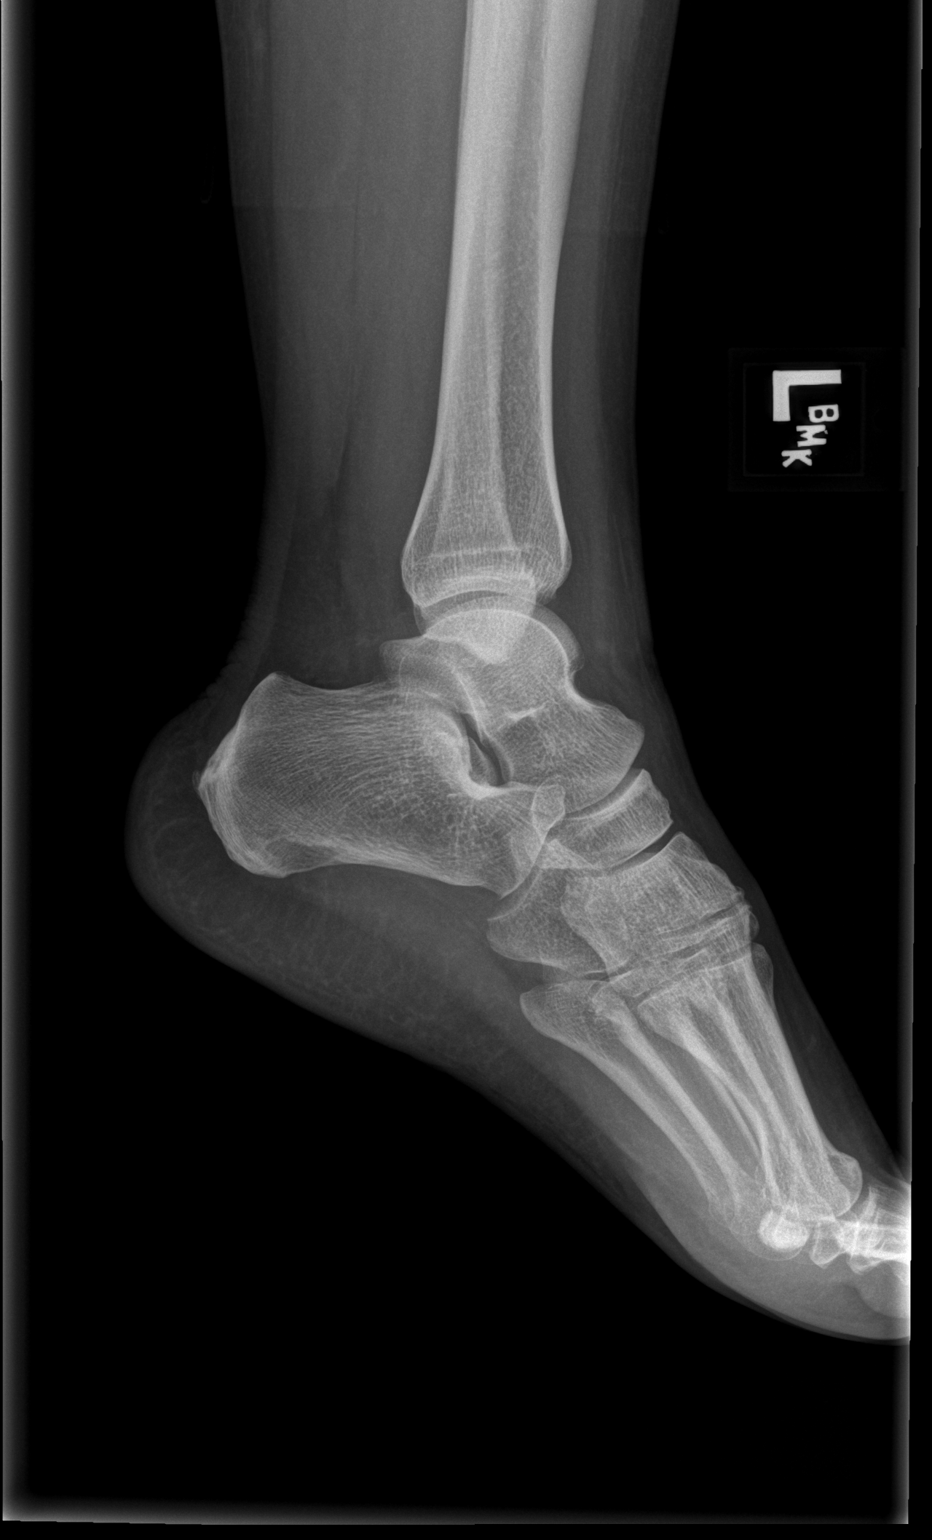

[3 of 3 positions shown; findings below may reference images not displayed]

FINDINGS: There is no evidence of fracture, dislocation, or joint effusion.
There is no evidence of arthropathy or other focal bone abnormality.
Soft tissues are unremarkable.
IMPRESSION: Negative.

## 2022-11-18 LAB — EXTERNAL GENERIC LAB PROCEDURE: COLOGUARD: NEGATIVE

## 2022-11-18 LAB — COLOGUARD: COLOGUARD: NEGATIVE

## 2023-01-27 NOTE — ED Provider Notes (Signed)
 ED Progress Note  Received sign out from Dr. Florie  ED I-PASS Handoff Illness Severit: stable Patient Summary: Toni Flowers is a 52 y.o. female hypertension, hyperlipidemia, diabetes, recurrent falls related to leg pain and weakness following with neurology presenting with mechanical fall, witnessed no head strike. No LOC no AC. Pending final read on XR. Pain controlled.  Action List:  Situation Awareness Horticulturist, commercial): Anticipate discharge. If occult frx, then follow up with ortho

## 2023-01-27 NOTE — ED Provider Notes (Signed)
 [facility] Emergency Department Provider Note  ED Clinical Impression   Final diagnoses:  Fall, initial encounter (Primary)    HPI, ED Course, Assessment and Plan   Initial Clinical Impression:  January 27, 2023 3:54 AM  Toni Flowers is a 52 y.o. female with past medical history of hypertension, hyperlipidemia, diabetes, recurrent falls related to leg pain and weakness following with neurology presenting with mechanical fall this evening.  The patient states that she tripped over something and fell on an outstretched left arm.  She reports diffuse left-sided pain since that time.  She did not strike her head nor did she lose consciousness.  She notes some headache since that time.  She is concerned regarding pain to her elbow, shoulder, rib cage, and of the left side of her back diffusely.  The patient's son and husband at bedside report that the patient cannot lock very long distances due to pain, she has had progression of weakness especially on the left side which is being worked up by neurology and she is receiving PT.  The patient has no vision changes, no vomiting though she is somewhat nauseous.  The patient uses a walker to get around short distances at home and does not travel far at baseline.  At her baseline, she is unable to who go from sitting to standing on her own.  BP 141/83   Pulse 68   Temp 36.3 C (97.3 F) (Oral)   Resp 17   LMP 12/28/2017   SpO2 99%  Initial vital signs are notable for afebrile, nontachycardic, normotensive, saturating well on room air  Pertinent physical exam: On my initial evaluation, the patient is nontoxic appearing, in no acute distress.    Medical Decision Making This is a 52 y.o. female with history as above presenting with after a fall with pain to the left side of her body. Initial considerations included intracranial hemorrhages, brain contusions, cervical spine fractures or dislocations, spinal cord injuries, MSK injuries, syncope  from cardiac etiologies, neurologic etiologies, among others. Patient denies any preceding, subsequent, or associated diaphoresis palpitations, chest pain, dyspnea, nausea, vomiting, or presyncope, any other new medical symptoms. Low concern for medical causes of fall at this time, will defer basic laboratory studies given history of nonsyncopal fall. Patient is well appearing without any signs or symptoms of serious injury. There is no midline c-spine tenderness to suggest cervical spine injury. Nexus criteria negative with no posterior c-spine tenderness, no evidence of intoxication, normal level of alertness, no focal neurologic defecit, and no distracting injuries.  Patient is neurologically intact with low likelihood of TBI according to [place] CT rule with no suspected open or depressed skull fracture, no sign of basilar skull fracture (hemotympanum, CSF leak, racoon eyes, or Battle's sign), not >/= 2 episodes of vomiting, not > 51 y/o, no amnesia prior to impact for > 30 minutes, no dangerous mechanism (Fall > 3 feet or > 5 stairs, pedestrian struck, ejection from MVC), and not on anticoagulation. There is no abdominal pain, ecchymosis, or tenderness on exam to suggest intraabdominal injury. Pelvis without evidence of injury though the patient does have left-sided pelvic tenderness, she is placing weight on the left side.  Will obtain x-ray of the chest to evaluate for displaced rib fractures, left hip, left elbow and shoulder to evaluate for fracture.  Further ED updates and updates to plan as per ED Course below:  ED Course: ED Course as of 01/27/23 0657  Wed Jan 27, 2023  [id number]  The patient reports modest improvement in pain following medications here.  Have updated them with regard to my personal read of radiology studies and they are agreeable to awaiting radiology reads for ultimate disposition.  In agreement with plan.  Questions answered.    MDM Elements I have reviewed recent and  relevant previous record, including: Neurology visit from 12/03/2022, PT visit from 01/25/2023 Independent Interpretation of Studies: X-ray(s) - no acute fractures noted Prescription drug(s) considered but not prescribed: Pain medications - multimodal approach is appropriate Diagnostic test(s) considered but not performed: Considered CT imaging as discussed above, defer  Discussion of Management with other Physicians, QHP or Appropriate Source: None Escalation of Care including OBS/Admission/Transfer was considered: However, patient was determined to be appropriate for outpatient management. ____________________________________________  The case was discussed with the attending physician who is in agreement with the above assessment and plan.   Past History   PAST MEDICAL HISTORY/PAST SURGICAL HISTORY:  Past Medical History:  Diagnosis Date  . Abnormal Pap smear of cervix    s/p colpo, bx with normal subsequent  . Anemia   . Diabetes mellitus (CMS-HCC)    pre-diabetes  . GERD (gastroesophageal reflux disease)   . Hypertension   . Irregular menses   . Obesity     Past Surgical History:  Procedure Laterality Date  . CERVICAL BIOPSY  W/ LOOP ELECTRODE EXCISION  2009  . LAPAROSCOPIC TUBAL LIGATION    . PR HYSTEROSCOPY,RMV MYOMA N/A 04/14/2013   Procedure: HYSTEROSCOPY SURGICAL; WITH REMOVAL OF LEIOMYOMATA;  Surgeon: Ozell JONETTA Model, MD;  Location: ASC OR [facility];  Service: [facility] Primary Gynecology  . PR NASAL/SINUS ENDOSCOPY,REMV TISS SPHENOID Bilateral 06/19/2016   Procedure: NASAL/SINUS ENDOSCOPY, SURGICAL, WITH SPHENOIDOTOMY; WITH REMOVAL OF TISSUE FROM THE SPHENOID SINUS;  Surgeon: Carlin Garnette Radford, MD;  Location: [facility] OR [facility];  Service: ENT  . PR NASAL/SINUS ENDOSCOPY,RMV TISS MAXILL SINUS Bilateral 06/19/2016   Procedure: NASAL/SINUS ENDOSCOPY, SURGICAL WITH MAXILLARY ANTROSTOMY; WITH REMOVAL OF TISSUE FROM MAXILLARY SINUS;  Surgeon: Carlin Garnette Radford, MD;  Location: ASC OR  [facility];  Service: ENT  . PR NASAL/SINUS NDSC W/RMVL TISS FROM FRONTAL SINUS Bilateral 06/19/2016   Procedure: NASAL/SINUS ENDOSCOPY, SURGICAL, WITH FRONTAL SINUS EXPLORATION, INCLUDING REMOVAL OF TISSUE FROM FRONTAL SINUS, WHEN PERFORMED;  Surgeon: Carlin Garnette Radford, MD;  Location: ASC OR [facility];  Service: ENT  . PR NASAL/SINUS NDSC W/TOTAL ETHOIDECTOMY Bilateral 06/19/2016   Procedure: NASAL/SINUS ENDOSCOPY, SURGICAL; WITH ETHMOIDECTOMY, TOTAL (ANTERIOR AND POSTERIOR);  Surgeon: Carlin Garnette Radford, MD;  Location: ASC OR [facility];  Service: ENT  . PR STEREOTACTIC COMP ASSIST PROC,CRANIAL,EXTRADURAL Bilateral 06/19/2016   Procedure: STEREOTACTIC COMPUTER-ASSISTED (NAVIGATIONAL) PROCEDURE; CRANIAL, EXTRADURAL;  Surgeon: Carlin Garnette Radford, MD;  Location: [facility] OR [facility];  Service: ENT  . SINUS SURGERY  2014  . TUBAL LIGATION      MEDICATIONS:   Current Facility-Administered Medications:  .  BUPivacaine HCl (MARCAINE) 0.25 % (2.5 mg/mL) injection 12.5 mg, 5 mL, Other, Once, Siddiqui, Zia A, MD .  lidocaine (ASPERCREME) 4 % 2 patch, 2 patch, Transdermal, Once, Othel Rollene BRAVO, MD, 2 patch at 01/27/23 [phone] .  lidocaine (XYLOCAINE) 5 mg/mL (0.5 %) injection 5 mL, 5 mL, Other, Once, Siddiqui, Zia A, MD .  methocarbamol (ROBAXIN) tablet 500 mg, 500 mg, Oral, QID, Othel Rollene BRAVO, MD, 500 mg at 01/27/23 0439 .  triamcinolone acetonide (KENALOG-40) injection 40 mg, 40 mg, Other, Once, Siddiqui, Zia A, MD  Current Outpatient Medications:  .  acetaminophen  (TYLENOL ) 325 MG tablet, Take  2 tablets (650 mg total) by mouth every four (4) hours as needed for pain., Disp: 30 tablet, Rfl: 0 .  atorvastatin (LIPITOR) 20 MG tablet, Take 1 tablet (20 mg total) by mouth daily. (Patient not taking: Reported on 01/19/2023), Disp: , Rfl:  .  cholecalciferol, vitamin D3-1,250 mcg, 50,000 unit,, 1,250 mcg (50,000 unit) capsule, Take 1 capsule (1,250 mcg total) by mouth daily. (Patient not taking: Reported on 01/19/2023),  Disp: , Rfl:  .  diclofenac (CATAFLAM) 50 MG tablet, Take 1 tablet (50 mg total) by mouth two (2) times a day. (Patient not taking: Reported on 01/19/2023), Disp: , Rfl:  .  diclofenac (VOLTAREN) 75 MG EC tablet, Take 1 tablet (75 mg total) by mouth Two (2) times a day. (Patient not taking: Reported on 01/19/2023), Disp: , Rfl:  .  fluticasone propionate (FLONASE) 50 mcg/actuation nasal spray, 2 sprays into each nostril daily., Disp: 16 g, Rfl: 11 .  gabapentin (NEURONTIN) 100 MG capsule, Take 3 capsules (300 mg total) by mouth Three (3) times a day for 90 doses. Start by taking 100 mg three times a day. After one week, increase to 100 mg in the morning, 100 mg in the afternoon, and 300 mg nightly. After two weeks, increase to 300 mg TID., Disp: 270 capsule, Rfl: 0 .  JARDIANCE 25 mg tablet, Take 1 tablet (25 mg total) by mouth daily., Disp: , Rfl:  .  lisinopril (PRINIVIL,ZESTRIL) 10 MG tablet, Take 1 tablet (10 mg total) by mouth daily., Disp: , Rfl:  .  metFORMIN (GLUCOPHAGE) 1000 MG tablet, Take 1 tablet (1,000 mg total) by mouth in the morning and 1 tablet (1,000 mg total) in the evening. Take with meals., Disp: , Rfl:  .  methocarbamol (ROBAXIN) 500 MG tablet, Take 1 tablet (500 mg total) by mouth two (2) times a day for 10 days., Disp: 20 tablet, Rfl: 0  ALLERGIES:  Patient has no known allergies.  SOCIAL HISTORY:  Social History   Tobacco Use  . Smoking status: Never    Passive exposure: Never  . Smokeless tobacco: Never  Substance Use Topics  . Alcohol use: Yes    Alcohol/week: 1.0 standard drink of alcohol    Types: 1 Standard drinks or equivalent per week    Comment: occ    FAMILY HISTORY: Family History  Problem Relation Age of Onset  . Diabetes Mother   . No Known Problems Father   . No Known Problems Sister   . No Known Problems Daughter   . No Known Problems Maternal Grandmother   . No Known Problems Maternal Grandfather   . No Known Problems Paternal Grandmother   .  No Known Problems Paternal Grandfather   . Endometrial cancer Neg Hx   . Cervical cancer Neg Hx   . Ovarian cancer Neg Hx   . BRCA 1/2 Neg Hx   . Breast cancer Neg Hx   . Cancer Neg Hx   . Colon cancer Neg Hx       Review of Systems  A review of systems was performed and relevant portions were as noted above in HPI   Physical Exam   VITAL SIGNS:   BP 141/83   Pulse 68   Temp 36.3 C (97.3 F) (Oral)   Resp 17   LMP 12/28/2017   SpO2 99%   Constitutional:  Alert and oriented. In no acute distress. Please see pertinent exam above as well. Head:  Normocephalic and atraumatic Eyes:  Conjunctivae are  normal, EOMI, PERRL ENT:  No notable congestion, mucous membranes moist, external ears normal, no notable stridor Cardiovascular:  Rate as vitals above. Appears warm and well perfused.  No murmurs rubs or gallops appreciated.  No tenderness to palpation of the chest wall, some tenderness to palpation of the left anterior rib cage.  No palpable step-offs. Respiratory:  Normal respiratory effort. Breath sounds are normal. Gastrointestinal:  Soft, non-distended, and nontender without rebound or guarding.  Genitourinary:  Deferred Musculoskeletal:   Normal range of motion in all extremities. No tenderness or edema noted in B/L lower extremities, patient ranging lower extremities at baseline, with baseline weakness in the bilateral lower extremities left greater than right, places weight on the bilateral legs, going from sit to stand with assistance as per her baseline There is tenderness to palpation of the left anterior shoulder and the left elbow, patient ranges all joints fully with some pain. Patient reports some tingling in the left upper extremity however sensation intact throughout all nerve distributions to light touch.  2+ radial and ulnar pulses Neurologic:  No gross focal neurologic deficits beyond baseline are appreciated No tenderness to palpation down entire midline  spine, there is tenderness to palpation diffusely to the left paraspinal region especially in the lumbar area Skin:  Skin is warm, dry and intact  Radiology   XR Chest 2 views  Preliminary Result    No acute cardiopulmonary abnormality.    XR Hip W Pelvis Left    (Results Pending)  XR Elbow 3 Or More Views Left    (Results Pending)  XR Shoulder 3 Or More Views Left    (Results Pending)    Labs   Labs Reviewed - No data to display  Pertinent labs & imaging results that were available during my care of the patient were reviewed by me and considered in my medical decision making. Labs and radiology studies included in my note may not constitute all ordered/reviewied labs during this encounter (see chart for details).    Please note- This chart has been created using [employer]. Chart creation errors have been sought, but may not always be located and such creation errors, especially pronoun confusion, do NOT reflect on the standard of medical care.    Othel Rollene BRAVO, MD Resident 01/27/23 [phone]

## 2023-08-27 NOTE — Progress Notes (Signed)
 Results are all in expected range.

## 2023-09-06 NOTE — Progress Notes (Signed)
 ASSESSMENT     [name] is a 53 y.o. female who presents for follow up evaluation of muscle weakness.  The patient is here today with ongoing proximal and distal lower extremity weakness. She was thoroughly evaluated by my colleagues for MS and other immune/inflammatory based conditions, however, given her physical exam and EMG results this is more likely a nerve condition mainly involving motor function. On exam today she continues to have both proximal and distal weakness in the bilateral lower extremities, left >right, with a mix of increased and decreased reflexes, ISO intact sensory modalities. 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 Nonetheless, given possibility for multiple motor neuropathy, will plan to trial IVIG to evaluate for treatment response.    PLAN   Consider IVIG, will wait for the patient's final decision to place the orders  Continue using rollator walker for safe ambulation Follow up IgA level Neurology referral to Dr. Almodovar    I personally spent 45 minutes face-to-face and non-face-to-face in the care of this patient, which includes all pre, intra, and post visit time on the date of service.  All documented time was specific to the E/M visit and does not include any procedures that may have been performed.  No orders of the defined types were placed in this encounter.   Toni Grimmer, MD Clinical Associate Professor Division of Neuromuscular Disorders Department of Neurology [place], [place]      HISTORY    Chief Concern: Toni Flowers is a 53 y.o. female who comes for consultation and evaluation from Lise Almarie Ada, [facility] regarding Weakness of both legs .  History of Present Illness: The history is obtained from the medical record, patient, and family,  including her daughter, who is here with the patient today. The patient's daughter served as an Equities trader today. She refused our interpreter services.   Patient reports that in December of 2019 she had a fall due to slipping on a patch of ice and came down on her back and left hip. She started having back pain mainly on the left side but some on the right as well. She denies that it radiated anywhere and she did not have any weakness or numbness, only pain. She was evaluated for this and was told she had nerve injury or irritation. Her pain was treated but never went away. Then about a year later, she started having frequent falls because her left leg would give out from under her without warning. She could never tell she was going to fall. She started limping more and noticed feeling weaker in her left leg. She did have transient pain in the left leg. Her left leg weakness slowly continued to worsen as did the falls.  Within the past year she started having muscle cramps in her legs, mostly in the calves, as well as her feet. The cramps mostly happen at night. She reports that stretching her legs makes pain travel from her toes up into the leg. Then within the past few months, she started having weakness in the right leg as well, not as bad as the left. Since February of this year she has required full time use of a rollator walker. This has helped reduce falls and gives her more stability. She has to take frequent rest breaks when she walks even 3-4 minutes. Her back and her  legs start to hurt the more she walks and her legs feel very heavy, but her back also hurts when she is sitting. She feels like if she could walker for a longer time then eventually the pain would get better but she is not able to do this. She has great difficulty getting in and out of chairs and off toilets. She reports that every time she stands from a seated position, she has pain in the lower back that travels down the left leg. She  denies any numbness, tingling, or burning pain. She started following with neurology, mainly the neuroimmunology clinic here at [facility] and underwent extensive testing including lumbar puncture and EMG. She then did a course of physical therapy but had no improvement in her muscle weakness. Her therapy was stopped due to insurance but also did not seem beneficial. She takes gabapentin and baclofen for her pain and muscle cramps. Earlier in her condition's course, she did experience some urinary incontinence however today she denies any incontinence of bladder or bowel.   Outside records and prior workup has been reviewed (see Media section).   Interval History 09/06/2023: Patient noting symptoms since 07/05/2023 are stable (not worse). Specifically, patient notes BLE weakness preventing her from walking independently or even lifting up legs. Denies any symptoms in her hands or speech/swallow (no dysphagia or dysarthria). Symptoms are persistent (do not wax-wayne). Does notice cramps in BLE starting distally and ascending proximally, mostly at night when sleeping, with forced toe positioning pointing down. Also has nonpainful muscle twitching ~6 months in LLE from ankle to hip (~every 3 days, lasting few seconds). Denies any sensory symptoms (numbness or tingling). Endorses some pain in buttocks from sitting significantly. Denies any urinary or fecal incontinence or saddle anesthesia.  MRI pelvis 06/2023: Mild lumbar degenerative changes without high-grade spinal canal or neural foraminal narrowing. Tiny ligamentum flavum cyst abutting the descending nerve root at L4-L5. Mild spinal canal narrowing at L5-S1 due to annular fissure and small central posterior protrusion.  ANA, ENA, ANCA all negative.  Past Medical History: She  has a past medical history of Abnormal Pap smear of cervix, Anemia, Diabetes mellitus, GERD (gastroesophageal reflux disease), Hypertension, Irregular menses, and Obesity.  Family History:  Her family history includes Cancer in her maternal cousin; Diabetes in her maternal aunt, maternal aunt, and mother; No Known Problems in her daughter, father, maternal grandfather, maternal grandmother, paternal grandfather, and paternal grandmother; Thyroid disease in her maternal aunt and sister. No family history of any neurological conditions or symptoms similar to current.  Social history: She  reports that she has never smoked. She has never been exposed to tobacco smoke. She has never used smokeless tobacco. She reports current alcohol use of about 1.0 standard drink of alcohol per week. She reports that she does not use drugs.   Medications: She is on the following medications: has a current medication list which includes the following prescription(s): baclofen, cholecalciferol (vitamin d3-1,250 mcg (50,000 unit)), gabapentin, jardiance, lisinopril, metformin, doxycycline, ozempic, and [DISCONTINUED] lidocaine, and the following Facility-Administered Medications: bupivacaine hcl, lidocaine, and triamcinolone acetonide.  Medication Reconciliation: In conjunction with entering the patient's initial or transfer medications, I performed medication reconciliation based on information available to me at the time.  Allergies: She has no known allergies.  Review of Systems: 11+ review of systems was reviewed. Please see scanned document for details. Significant positives and negatives are detailed in the HPI.   Data Review:   Laboratory Workup Normal/negative vitamin B12, B1, SPEP/IPEP,  TSH, aldolase, Ganglioside ab panel, lyme disease, vit D, syphillis, HIV Mildly elevated CK (339) -> 446 Elevated Hgb A1c Mildly elevated GAD65 ab on paraneoplastic/autoimmune/encephalopathy panel Elevated neurofilament light chains (39) -> 45 Reactive Ehrlichia acute abx2 (proactively treated with course of doxycycline, no impact on symptoms)  Imaging Workup MRI Brain w/wo contrast 05/10/2018 -  unremarkable MRI Brain w/wo contrast 02/15/2023 - postsurgical changes from prior endoscopic sinus surgery, 2 small hyperintense foci in white matter of bilateral frontal lobes, stable from prior imaging  MRI Cervical Spine w/wo 02/15/2023 - no significant stenosis, no lesions  MRI Thoracic Spine w/wo 02/15/2023 - no significant stenosis, no lesions, ligamentum flavum hypertrophy T6-T7  MRI Lumbar Spine wo 08/04/2019 - multilevel degenerative disc disease, no significant stenosis MRI Lumbar Spine wo 08/01/2021 - L5-S1 small central annular fissure with central disc protrusion, mild canal narrowing and neural foraminal narrowing MRI Lumbar Spine w/wo 02/24/2023 - similar L5-S1 disc protrusion, otherwise unremarkable   Procedure Workup Lumbar Puncture 01/2023 - normal IgG, cell count, albumin, protein, glucose, cytology, cryptococcal antigen, enceph/autoimmune/paraneoplastic, VDRL, [place],  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   EMG at [facility] 01/13/2023 - Abnormal study. These electrodiagnostic findings are most consistent with the dysfunction of motor neurons, their axons or both in b/l legs. (Multilevel lumbar radiculopthy/itis or MND, preserved patella reflexes in the setting of significant proximal leg weakness are consistent with myelopathy/pyramidal tract involvement/UMN dysfunction). 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 Her blood pressure is 125/68 and her pulse is 73.   Pain score-     09/06/23 1256  PainSc: 5   /10  Physical Examination: General: Well developed, well-nourished in no acute distress. HEENT:  normocephalic, atraumatic, no oral lesions or tongue lacerations, mucous membranes are moist Ext: Mild dependent edema  nontender, no ulcer. Warm, well perfused. There is no evidence of pes cavus or hammer toes.  Skin: No significant rashes. Neck: Supple.   Neurological Examination:  Mental Status:  Patient is awake, alert, and appropriately oriented. Attentive to examiner, cooperative with exam. Language is fluent with normal comprehension and repetition. Fund of knowledge is normal. Memory is intact. Patient is able to give details of her own history.  Speech is normal without any evidence of dysarthria.  Cranial nerves:   II: Visual fields are full. Pupils are equal, round, and reactive to light. III, IV and VI: extraocular movements are full. There is no nystagmus. V: Facial sensation is intact and symmetric. VII: The face is strong and symmetric. VIII: Hearing is intact to finger rub bilaterally. IX and X: Soft palate elevates symmetrically in the midline. XI: Shoulder shrug and sternocleidomastoids are strong. XII: Tongue is midline, normal movement, no atrophy or fasciculations.  Motor Examination:  Muscle tone is normal. Atrophy noted throughout left leg compared to right. Persistent muscle cramps in the lower legs and feet. No increased tone.   Strength evaluation:    MUSCLE Right (MRC Grade) Left (MRC Grade)  Neck Flexor 5   Neck Extensor 5   Arm abductor 4+ 5  Elbow extensor 5 5  Elbow flexor  5 5  Wrist extensor 5 5  Wrist  flexor 5 5  Finger flexor  5 5  Finger extensor 5 5  Finger abduction 5 5  Thumb abduction 5 5  Hip flexor  2 1      Hip abduction 4 4  Hip adduction 3 3  Knee extensor  5 4  Knee flexor  3 3  Ank Dorsiflexor  0 0  Ank Plantarflexor  4 4  Foot Inversion 4 4  Foot Eversion 3 3  Toe Extensors 0 0  Toe Flexors 5 5  Other: big toe df/pf Df 0, pf 3 Df 0, pf 3+   Reflexes:  Reflexes Right Left Comments  Biceps 3+, brik 2+   Triceps 2+,  brisk 2+, brisk   Brachioradialis 3+ 3+   Patella 3+ 2+   Achilles 0+ 0+   Plantar Response mute mute   Hoffman's Sign Positive Positive    Does have bilateral pectoralis reflexes, no clonus  Sensory: Light touch sensation is normal in the upper and lower extremities. Pinprick sensation is normal in the upper and lower extremities. Vibration sense is normal in the upper and lower extremities. Proprioception is normal at the great toes.  Coordination and Gait: Finger-to-nose intact without dysmetria. Heel-to-shin difficult due to muscle weakness. There is no evidence of tremor. Rapid alternating movements are intact without dysrhythmia. Fine motor control is normal.   Romberg sign unable to assess due to instability with standing  Difficulty with ambulation, requires use of rollator walker, slow and steady, drags her feet. Unable to assess toe, heel, or tandem gait.

## 2023-10-25 ENCOUNTER — Other Ambulatory Visit: Payer: Self-pay

## 2023-10-25 ENCOUNTER — Emergency Department
Admission: EM | Admit: 2023-10-25 | Discharge: 2023-10-26 | Disposition: A | Discharge status: Home / Self Care | Attending: Emergency Medicine | Admitting: Emergency Medicine

## 2023-10-25 DIAGNOSIS — E119 Type 2 diabetes mellitus without complications: Secondary | ICD-10-CM | POA: Diagnosis not present

## 2023-10-25 DIAGNOSIS — N3090 Cystitis, unspecified without hematuria: Secondary | ICD-10-CM | POA: Diagnosis not present

## 2023-10-25 DIAGNOSIS — I10 Essential (primary) hypertension: Secondary | ICD-10-CM | POA: Diagnosis not present

## 2023-10-25 DIAGNOSIS — R1030 Lower abdominal pain, unspecified: Secondary | ICD-10-CM | POA: Diagnosis present

## 2023-10-25 LAB — CBC WITH DIFFERENTIAL/PLATELET
Abs Immature Granulocytes: 0.01 K/uL (ref 0.00–0.07)
Basophils Absolute: 0 K/uL (ref 0.0–0.1)
Basophils Relative: 0 %
Eosinophils Absolute: 0.1 K/uL (ref 0.0–0.5)
Eosinophils Relative: 1 %
HCT: 45.4 % (ref 36.0–46.0)
Hemoglobin: 14.8 g/dL (ref 12.0–15.0)
Immature Granulocytes: 0 %
Lymphocytes Relative: 25 %
Lymphs Abs: 2.1 K/uL (ref 0.7–4.0)
MCH: 29.4 pg (ref 26.0–34.0)
MCHC: 32.6 g/dL (ref 30.0–36.0)
MCV: 90.3 fL (ref 80.0–100.0)
Monocytes Absolute: 0.4 K/uL (ref 0.1–1.0)
Monocytes Relative: 5 %
Neutro Abs: 5.7 K/uL (ref 1.7–7.7)
Neutrophils Relative %: 69 %
Platelets: 259 K/uL (ref 150–400)
RBC: 5.03 MIL/uL (ref 3.87–5.11)
RDW: 13.1 % (ref 11.5–15.5)
WBC: 8.3 K/uL (ref 4.0–10.5)
nRBC: 0 % (ref 0.0–0.2)

## 2023-10-25 LAB — COMPREHENSIVE METABOLIC PANEL WITH GFR
ALT: 26 U/L (ref 0–44)
AST: 16 U/L (ref 15–41)
Albumin: 4.1 g/dL (ref 3.5–5.0)
Alkaline Phosphatase: 64 U/L (ref 38–126)
Anion gap: 11 (ref 5–15)
BUN: 18 mg/dL (ref 6–20)
CO2: 22 mmol/L (ref 22–32)
Calcium: 9.5 mg/dL (ref 8.9–10.3)
Chloride: 105 mmol/L (ref 98–111)
Creatinine, Ser: <0.3 mg/dL — ABNORMAL LOW (ref 0.44–1.00)
Glucose, Bld: 116 mg/dL — ABNORMAL HIGH (ref 70–99)
Potassium: 3.9 mmol/L (ref 3.5–5.1)
Sodium: 138 mmol/L (ref 135–145)
Total Bilirubin: 0.7 mg/dL (ref 0.0–1.2)
Total Protein: 7.7 g/dL (ref 6.5–8.1)

## 2023-10-25 LAB — LIPASE, BLOOD: Lipase: 42 U/L (ref 11–51)

## 2023-10-25 NOTE — ED Triage Notes (Signed)
 Arrives with reports of pelvic pain, vaginal pain with no discharge or bleeding. Pt reports pain with urination and bloody urine. Symptoms began today.

## 2023-10-25 NOTE — ED Provider Notes (Signed)
 [facility] Provider Note    Event Date/Time   First MD Initiated Contact with Patient 10/25/23 2354     (approximate)   History   Vaginal Pain and Pelvic Pain   HPI  Toni Flowers is a 53 y.o. female   Past medical history of diabetes and hypertension who presents to the Emergency Department with dysuria and pink-tinged urine, suprapubic pain for the last 1 day.  No fevers no chills.  She does have chronic unchanged left-sided lower back pain.  No other acute medical complaints  Information obtained via Spanish interpreter  Independent Historian contributed to assessment above: Husband corroborates information given above  External Medical Documents Reviewed: Urine culture from June 2024 w [name]      Physical Exam   Triage Vital Signs: ED Triage Vitals  Encounter Vitals Group     BP 10/25/23 1819 103/76     Girls Systolic BP Percentile --      Girls Diastolic BP Percentile --      Boys Systolic BP Percentile --      Boys Diastolic BP Percentile --      Pulse Rate 10/25/23 1819 70     Resp 10/25/23 1819 16     Temp 10/25/23 1819 98.5 F (36.9 C)     Temp Source 10/25/23 1819 Oral     SpO2 10/25/23 1819 99 %     Weight 10/25/23 1824 128 lb (58.1 kg)     Height 10/25/23 1824 5' 5 (1.651 m)     Head Circumference --      Peak Flow --      Pain Score 10/25/23 1824 8     Pain Loc --      Pain Education --      Exclude from Growth Chart --     Most recent vital signs: Vitals:   10/25/23 1819 10/25/23 2344  BP: 103/76 107/66  Pulse: 70 81  Resp: 16 18  Temp: 98.5 F (36.9 C)   SpO2: 99% 97%    General: Awake, no distress.  CV:  Good peripheral perfusion.  Resp:  Normal effort.  Abd:  No distention.  Other:  Mild suprapubic tenderness without rigidity or guarding, benign nonfocal exam otherwise.  No fever normal vital signs.   ED Results / Procedures / Treatments   Labs (all labs ordered are listed, but  only abnormal results are displayed) Labs Reviewed  COMPREHENSIVE METABOLIC PANEL WITH GFR - Abnormal; Notable for the following components:      Result Value   Glucose, Bld 116 (*)    Creatinine, Ser <0.30 (*)    All other components within normal limits  URINALYSIS, ROUTINE W REFLEX MICROSCOPIC - Abnormal; Notable for the following components:   Color, Urine STRAW (*)    APPearance HAZY (*)    Glucose, UA >=500 (*)    Hgb urine dipstick MODERATE (*)    Leukocytes,Ua MODERATE (*)    Bacteria, UA FEW (*)    All other components within normal limits  POC URINE PREG, ED - Normal  LIPASE, BLOOD  CBC WITH DIFFERENTIAL/PLATELET  PREGNANCY, URINE     I ordered and reviewed the above labs they are notable for white blood cell count normal.  Urinalysis shows bacteria inflammatory changes  PROCEDURES:  Critical Care performed: No  Procedures   MEDICATIONS ORDERED IN ED: Medications  cephALEXin  (KEFLEX ) capsule 500 mg (has no administration in time range)  acetaminophen  (TYLENOL ) tablet 1,000  mg (has no administration in time range)  ibuprofen  (ADVIL ) tablet 600 mg (has no administration in time range)    IMPRESSION / MDM / ASSESSMENT AND PLAN / ED COURSE  I reviewed the triage vital signs and the nursing notes.                                Patient's presentation is most consistent with acute presentation with potential threat to life or bodily function.  Differential diagnosis includes, but is not limited to, urinary tract infection, considered pyelonephritis, sepsis, urinary tract stone    MDM:    Symptoms and exam consistent with uncomplicated lower urinary tract infection and less likely sepsis pyelonephritis or renal colic associated with stone or other obstructive pathologies.  Pansensitive E. coli from urine culture last year, Keflex  prescribed at the end and worked well for patient.  Will prescribe the same with the first dose in the emergency department tonight.   Discharged with close PMD follow-up.       FINAL CLINICAL IMPRESSION(S) / ED DIAGNOSES   Final diagnoses:  Cystitis     Rx / DC Orders   ED Discharge Orders          Ordered    cephALEXin  (KEFLEX ) 500 MG capsule  4 times daily        10/26/23 0134             Note:  This document was prepared using Dragon voice recognition software and may include unintentional dictation errors.    Cyrena Mylar, MD 10/26/23 [phone]

## 2023-10-26 LAB — URINALYSIS, ROUTINE W REFLEX MICROSCOPIC
Bilirubin Urine: NEGATIVE
Glucose, UA: >=500 mg/dL — AB
Ketones, ur: NEGATIVE mg/dL
Nitrite: NEGATIVE
Protein, ur: NEGATIVE mg/dL
Specific Gravity, Urine: 1.022 (ref 1.005–1.030)
WBC, UA: >50 WBC/hpf (ref 0–5)
pH: 5 (ref 5.0–8.0)

## 2023-10-26 LAB — PREGNANCY, URINE: Preg Test, Ur: NEGATIVE

## 2023-10-26 LAB — POC URINE PREG, ED: Preg Test, Ur: NEGATIVE

## 2023-10-26 MED ORDER — ACETAMINOPHEN 500 MG PO TABS
1000.0000 mg | ORAL_TABLET | Freq: Once | ORAL | Status: AC
  Administered 2023-10-26 (×2): 1000 mg via ORAL
  Filled 2023-10-26: qty 2

## 2023-10-26 MED ORDER — IBUPROFEN 600 MG PO TABS
600.0000 mg | ORAL_TABLET | Freq: Once | ORAL | Status: AC
  Administered 2023-10-26 (×2): 600 mg via ORAL
  Filled 2023-10-26: qty 1

## 2023-10-26 MED ORDER — CEPHALEXIN 500 MG PO CAPS
500.0000 mg | ORAL_CAPSULE | Freq: Once | ORAL | Status: AC
  Administered 2023-10-26 (×2): 500 mg via ORAL
  Filled 2023-10-26: qty 1

## 2023-10-26 MED ORDER — CEPHALEXIN 500 MG PO CAPS: 500.0000 mg | ORAL_CAPSULE | Freq: Four times a day (QID) | ORAL | 0 refills | Status: AC

## 2023-10-26 NOTE — ED Notes (Signed)
 Patient and spouse provided with discharge instructions including prescriptions x1 and importance of follow up appt as needed with stated understanding. Instructions given via interpreter. Patient stable and wheeled out via spouse on dispo.

## 2023-10-26 NOTE — ED Notes (Signed)
 POC pregnancy test was negative

## 2023-10-26 NOTE — Discharge Instructions (Addendum)
 Tome acetaminofn 650 mg e ibuprofeno 400 mg cada 6 horas para Chief Technology Officer. Tmelo con alimentos.  Tome antibiticos durante 7 das completos, hasta completarlos.  Gracias por elegirnos para su atencin mdica hoy!  Consulte con su mdico de cabecera esta semana para una cita de seguimiento.  Si presenta sntomas nuevos, que empeoran o inesperados, llame a su mdico de inmediato o regrese a urgencias para una reevaluacin.  Fue un placer atenderle hoy.  Dr. Ginnie RAMAN. Cyrena  ---    Take acetaminophen  650 mg and ibuprofen  400 mg every 6 hours for pain.  Take with food.  Take antibiotic for 7 full days - take to completion.   Thank you for choosing us  for your health care today!  Please see your primary doctor this week for a follow up appointment.   If you have any new, worsening, or unexpected symptoms call your doctor right away or come back to the emergency department for reevaluation.  It was my pleasure to care for you today.   Ginnie EDISON Cyrena, MD

## 2024-04-24 ENCOUNTER — Ambulatory Visit: Admitting: Family Medicine
